# Patient Record
Sex: Male | Born: 1950 | Race: White | Hispanic: No | Marital: Married | Smoking: Former smoker
Health system: Southern US, Community
[De-identification: ages and names within clinical notes are randomized; demographics above are authoritative.]

## PROBLEM LIST (undated history)

## (undated) DIAGNOSIS — N200 Calculus of kidney: Secondary | ICD-10-CM

## (undated) DIAGNOSIS — F32A Depression, unspecified: Secondary | ICD-10-CM

## (undated) DIAGNOSIS — G5603 Carpal tunnel syndrome, bilateral upper limbs: Secondary | ICD-10-CM

## (undated) DIAGNOSIS — R269 Unspecified abnormalities of gait and mobility: Secondary | ICD-10-CM

## (undated) DIAGNOSIS — G35 Multiple sclerosis: Secondary | ICD-10-CM

## (undated) DIAGNOSIS — M545 Low back pain, unspecified: Secondary | ICD-10-CM

## (undated) DIAGNOSIS — G8929 Other chronic pain: Secondary | ICD-10-CM

## (undated) DIAGNOSIS — E1142 Type 2 diabetes mellitus with diabetic polyneuropathy: Secondary | ICD-10-CM

## (undated) DIAGNOSIS — H3530 Unspecified macular degeneration: Secondary | ICD-10-CM

## (undated) DIAGNOSIS — E119 Type 2 diabetes mellitus without complications: Secondary | ICD-10-CM

## (undated) DIAGNOSIS — R350 Frequency of micturition: Secondary | ICD-10-CM

## (undated) DIAGNOSIS — M79606 Pain in leg, unspecified: Secondary | ICD-10-CM

## (undated) DIAGNOSIS — E669 Obesity, unspecified: Secondary | ICD-10-CM

## (undated) DIAGNOSIS — R5382 Chronic fatigue, unspecified: Secondary | ICD-10-CM

## (undated) DIAGNOSIS — F329 Major depressive disorder, single episode, unspecified: Secondary | ICD-10-CM

## (undated) HISTORY — DX: Obesity, unspecified: E66.9

## (undated) HISTORY — DX: Unspecified abnormalities of gait and mobility: R26.9

## (undated) HISTORY — DX: Other chronic pain: G89.29

## (undated) HISTORY — DX: Frequency of micturition: R35.0

## (undated) HISTORY — DX: Major depressive disorder, single episode, unspecified: F32.9

## (undated) HISTORY — DX: Type 2 diabetes mellitus with diabetic polyneuropathy: E11.42

## (undated) HISTORY — PX: HERNIA REPAIR: SHX51

## (undated) HISTORY — DX: Carpal tunnel syndrome, bilateral upper limbs: G56.03

## (undated) HISTORY — DX: Multiple sclerosis: G35

## (undated) HISTORY — DX: Pain in leg, unspecified: M79.606

## (undated) HISTORY — DX: Low back pain: M54.5

## (undated) HISTORY — DX: Low back pain, unspecified: M54.50

## (undated) HISTORY — DX: Unspecified macular degeneration: H35.30

## (undated) HISTORY — DX: Chronic fatigue, unspecified: R53.82

## (undated) HISTORY — DX: Type 2 diabetes mellitus without complications: E11.9

## (undated) HISTORY — DX: Calculus of kidney: N20.0

## (undated) HISTORY — PX: EYE SURGERY: SHX253

## (undated) HISTORY — DX: Depression, unspecified: F32.A

## (undated) HISTORY — PX: TONSILLECTOMY: SUR1361

---

## 1998-05-10 ENCOUNTER — Ambulatory Visit (HOSPITAL_COMMUNITY): Admission: RE | Admit: 1998-05-10 | Discharge: 1998-05-10 | Payer: Self-pay | Admitting: Family Medicine

## 1999-05-29 ENCOUNTER — Ambulatory Visit (HOSPITAL_COMMUNITY): Admission: RE | Admit: 1999-05-29 | Discharge: 1999-05-29 | Payer: Self-pay | Admitting: Gastroenterology

## 2002-07-24 ENCOUNTER — Ambulatory Visit (HOSPITAL_COMMUNITY): Admission: RE | Admit: 2002-07-24 | Discharge: 2002-07-24 | Payer: Self-pay | Admitting: Gastroenterology

## 2003-12-31 ENCOUNTER — Encounter: Admission: RE | Admit: 2003-12-31 | Discharge: 2003-12-31 | Payer: Self-pay | Admitting: Family Medicine

## 2004-01-27 ENCOUNTER — Ambulatory Visit (HOSPITAL_COMMUNITY): Admission: RE | Admit: 2004-01-27 | Discharge: 2004-01-27 | Payer: Self-pay | Admitting: Neurology

## 2004-03-27 ENCOUNTER — Ambulatory Visit: Payer: Self-pay | Admitting: Family Medicine

## 2004-06-06 ENCOUNTER — Ambulatory Visit: Payer: Self-pay | Admitting: Family Medicine

## 2004-08-30 ENCOUNTER — Ambulatory Visit: Payer: Self-pay | Admitting: Family Medicine

## 2004-11-13 ENCOUNTER — Ambulatory Visit (HOSPITAL_COMMUNITY): Admission: RE | Admit: 2004-11-13 | Discharge: 2004-11-13 | Payer: Self-pay | Admitting: Family Medicine

## 2004-12-14 ENCOUNTER — Ambulatory Visit: Payer: Self-pay | Admitting: Family Medicine

## 2005-01-10 ENCOUNTER — Ambulatory Visit: Payer: Self-pay | Admitting: Family Medicine

## 2005-04-12 ENCOUNTER — Ambulatory Visit: Payer: Self-pay | Admitting: Family Medicine

## 2005-07-19 ENCOUNTER — Ambulatory Visit: Payer: Self-pay | Admitting: Family Medicine

## 2005-08-06 ENCOUNTER — Ambulatory Visit (HOSPITAL_COMMUNITY): Admission: RE | Admit: 2005-08-06 | Discharge: 2005-08-06 | Payer: Self-pay | Admitting: Family Medicine

## 2005-10-23 ENCOUNTER — Ambulatory Visit: Payer: Self-pay | Admitting: Family Medicine

## 2006-01-03 ENCOUNTER — Ambulatory Visit: Payer: Self-pay | Admitting: Family Medicine

## 2006-01-29 ENCOUNTER — Ambulatory Visit: Payer: Self-pay | Admitting: Family Medicine

## 2006-02-18 ENCOUNTER — Encounter: Admission: RE | Admit: 2006-02-18 | Discharge: 2006-05-19 | Payer: Self-pay | Admitting: Specialist

## 2006-04-11 ENCOUNTER — Ambulatory Visit: Payer: Self-pay | Admitting: Family Medicine

## 2006-07-11 ENCOUNTER — Ambulatory Visit: Payer: Self-pay | Admitting: Family Medicine

## 2006-10-10 ENCOUNTER — Ambulatory Visit: Payer: Self-pay | Admitting: Family Medicine

## 2006-12-31 ENCOUNTER — Encounter: Admission: RE | Admit: 2006-12-31 | Discharge: 2007-01-23 | Payer: Self-pay | Admitting: Family Medicine

## 2007-08-04 ENCOUNTER — Ambulatory Visit (HOSPITAL_COMMUNITY): Admission: RE | Admit: 2007-08-04 | Discharge: 2007-08-04 | Payer: Self-pay | Admitting: *Deleted

## 2007-09-14 ENCOUNTER — Encounter: Admission: RE | Admit: 2007-09-14 | Discharge: 2007-09-14 | Payer: Self-pay | Admitting: Neurology

## 2008-07-23 ENCOUNTER — Encounter: Admission: RE | Admit: 2008-07-23 | Discharge: 2008-07-23 | Payer: Self-pay | Admitting: Family Medicine

## 2008-10-07 ENCOUNTER — Encounter: Admission: RE | Admit: 2008-10-07 | Discharge: 2008-10-07 | Payer: Self-pay | Admitting: Family Medicine

## 2008-10-12 ENCOUNTER — Ambulatory Visit (HOSPITAL_BASED_OUTPATIENT_CLINIC_OR_DEPARTMENT_OTHER): Admission: RE | Admit: 2008-10-12 | Discharge: 2008-10-12 | Payer: Self-pay | Admitting: Urology

## 2010-05-14 ENCOUNTER — Encounter: Payer: Self-pay | Admitting: Family Medicine

## 2010-07-31 LAB — POCT I-STAT 4, (NA,K, GLUC, HGB,HCT)
Glucose, Bld: 181 mg/dL — ABNORMAL HIGH (ref 70–99)
HCT: 47 % (ref 39.0–52.0)
Hemoglobin: 16 g/dL (ref 13.0–17.0)
Potassium: 4.9 mEq/L (ref 3.5–5.1)
Sodium: 139 mEq/L (ref 135–145)

## 2010-07-31 LAB — GLUCOSE, CAPILLARY: Glucose-Capillary: 154 mg/dL — ABNORMAL HIGH (ref 70–99)

## 2010-09-05 NOTE — Op Note (Signed)
NAME:  Derek Collins, Derek Collins                 ACCOUNT NO.:  [id number]   MEDICAL RECORD NO.:  [id number]          PATIENT TYPE:  AMB   LOCATION:  [facility]                         FACILITY:  [facility]   PHYSICIAN:  Excell Seltzer. Annabell Howells, M.D.    DATE OF BIRTH:  Nov 03, 1950   DATE OF PROCEDURE:  10/12/2008  DATE OF DISCHARGE:                               OPERATIVE REPORT   PROCEDURE:  Left cystoscopy, left retrograde pyelogram with  interpretation, left ureteroscopic stone extraction with holmium laser  lithotripsy and left stent insertion.   PREOPERATIVE DIAGNOSIS:  Left mid ureteral stone.   POSTOPERATIVE DIAGNOSIS:  Left mid ureteral stone.   SURGEON:  Dr. Bjorn Pippin   ANESTHESIA:  General.   SPECIMEN:  Stone fragments.   DRAINS:  6-French 26 cm double-J stent.   COMPLICATIONS:  None.   INDICATIONS:  Mr. Cumbie is a 60 year old white male with a 6-8 mm left  mid ureteral stone who is to undergo ureteroscopy.   FINDINGS AND PROCEDURE:  The patient was taken operating room after  receiving Cipro. A general anesthetic was induced.  He was placed in  lithotomy position.  His perineum and genitalia were prepped with  Betadine solution and he was draped in the usual sterile fashion.  Cystoscopy was performed using a 22-French scope and 12 degree lens  examination revealed a normal urethra.  The external sphincter was  intact.  Prostatic urethra was short but had trilobar hyperplasia with  mild obstruction.  Examination of bladder revealed mild trabeculation.  No tumor, stones or inflammation were noted.  Ureteral orifices were  unremarkable.   Left ureteral orifice was cannulated with a 5-French catheter and  contrast instilled.   Left retrograde pyelogram revealed an ovoid filling defect in the upper  portion of the mid ureter consistent with a stone seen on CT scan.  He  was felt to have probable uric acid stone.  There was no proximal  dilation.   After retrograde pyelograms a wire was placed to  the kidney and a 14-  French introducer sheath inner core dilator was then introduced to the  level of the stone.  A 6-French short ureteroscope was passed as far  proximally as possible and the stone was not reached.  The introducer  sheath was then reinserted with the sheath included, the core was  removed along the wire and the 6-French flexible ureteroscope was  inserted.  Initially the stone was not seen.  However on withdrawal of  the scope and pulling back somewhat on the introducer sheath, the stone  was visualized.  The stone was then engaged with a 200 micron holmium  laser fiber at 0.5 watts and 10 Hz.  The stone was fragmented into  manageable fragments which were then removed using a combination of  irrigation and a nitinol basket.  Once all significant fragments were  removed and the ureter was inspected along with the kidney,  a guidewire  was passed back through the introducer sheath which was then removed.   Cystoscope was reinserted over the introducer sheath and a 6-French 26-  cm double-J stent with string was inserted over the wire to the kidney  under fluoroscopic guidance.  The wire was removed leaving good coil in  the kidney and good coil in the bladder.  The stent string was secured  to the patient's penis.  He was taken down from lithotomy position.  His  anesthetic was reversed.  He was moved to the recovery room in stable  condition.  There were no complications.      Excell Seltzer. Annabell Howells, M.D.  Electronically Signed     JJW/[id number]  D:  10/12/2008  T:  10/13/2008  Job:  [id number]   cc:   Delaney Meigs, M.D.  Fax: [phone]

## 2010-09-08 NOTE — Procedures (Signed)
[place]  Patient:    Derek Collins                         MRN: [id number] Proc. Date: 05/29/99 Adm. Date:  [id number] Attending:  Orland Mustard CC:         Delaney Meigs, M.D., [place], [place]                           Procedure Report  PROCEDURE:  Colonoscopy and polypectomy.  MEDICATION:  Fentanyl 70 mcg, versed 7 mg IV.  INDICATIONS FOR PROCEDURE:  Occasional rectal bleeding and pain in a gentleman ith a very strong family history of colon polyps.  DESCRIPTION OF THE PROCEDURE:  The procedure had been explained to the patient nd consent obtained.  With the patient in left lateral decubitus position the Olympus Adult video colonoscope inserted and advanced under direct visualization.  The rep was excellent.  It was advanced around to the cecum without difficulty.  The ileocecal valve was seen.  Typical cecal configuration was visualized.  The scope was withdrawn.  The cecum, ascending colon, hepatic flexure, and transverse colon were seen well and in the distal transverse colon a 1 cm polyp was encountered.  This was removed with a snare and sucked through the scope.  There were no polyps seen in the descending or sigmoid colon.  No significant diverticular disease.  Internal hemorrhoids were seen in the rectum upon removal of the scope.  The scope was withdrawn and the patient tolerated the procedure well and was maintained on low-flow oxygen and pulse oximeter throughout the procedure with no obvious problem.  ASSESSMENT: 1. Transverse colon polyp removed. 2. Internal hemorrhoids.  PLAN: 1. Will check pathology. 2. Will recommend repeating in three years. DD:  05/29/99 TD:  05/29/99 Job: [id number]

## 2012-03-06 DIAGNOSIS — R6889 Other general symptoms and signs: Secondary | ICD-10-CM | POA: Insufficient documentation

## 2012-03-06 DIAGNOSIS — G35D Multiple sclerosis, unspecified: Secondary | ICD-10-CM | POA: Insufficient documentation

## 2012-03-06 DIAGNOSIS — G63 Polyneuropathy in diseases classified elsewhere: Secondary | ICD-10-CM | POA: Insufficient documentation

## 2012-03-06 DIAGNOSIS — G35 Multiple sclerosis: Secondary | ICD-10-CM | POA: Insufficient documentation

## 2012-03-06 DIAGNOSIS — M79609 Pain in unspecified limb: Secondary | ICD-10-CM | POA: Insufficient documentation

## 2012-03-06 DIAGNOSIS — Z5181 Encounter for therapeutic drug level monitoring: Secondary | ICD-10-CM | POA: Insufficient documentation

## 2012-03-06 DIAGNOSIS — E1142 Type 2 diabetes mellitus with diabetic polyneuropathy: Secondary | ICD-10-CM | POA: Insufficient documentation

## 2012-03-06 DIAGNOSIS — M545 Low back pain: Secondary | ICD-10-CM | POA: Insufficient documentation

## 2012-03-06 DIAGNOSIS — G5600 Carpal tunnel syndrome, unspecified upper limb: Secondary | ICD-10-CM | POA: Insufficient documentation

## 2012-03-06 DIAGNOSIS — R269 Unspecified abnormalities of gait and mobility: Secondary | ICD-10-CM | POA: Insufficient documentation

## 2012-03-06 DIAGNOSIS — S339XXA Sprain of unspecified parts of lumbar spine and pelvis, initial encounter: Secondary | ICD-10-CM | POA: Insufficient documentation

## 2012-09-03 ENCOUNTER — Encounter: Payer: Self-pay | Admitting: Neurology

## 2012-09-03 ENCOUNTER — Ambulatory Visit (INDEPENDENT_AMBULATORY_CARE_PROVIDER_SITE_OTHER): Payer: BC Managed Care – PPO | Admitting: Neurology

## 2012-09-03 VITALS — BP 127/78 | HR 96 | Ht 71.5 in | Wt 198.0 lb

## 2012-09-03 DIAGNOSIS — E1142 Type 2 diabetes mellitus with diabetic polyneuropathy: Secondary | ICD-10-CM

## 2012-09-03 DIAGNOSIS — S339XXA Sprain of unspecified parts of lumbar spine and pelvis, initial encounter: Secondary | ICD-10-CM

## 2012-09-03 DIAGNOSIS — R6889 Other general symptoms and signs: Secondary | ICD-10-CM

## 2012-09-03 DIAGNOSIS — G35 Multiple sclerosis: Secondary | ICD-10-CM

## 2012-09-03 DIAGNOSIS — M545 Low back pain, unspecified: Secondary | ICD-10-CM

## 2012-09-03 DIAGNOSIS — G35D Multiple sclerosis, unspecified: Secondary | ICD-10-CM

## 2012-09-03 DIAGNOSIS — Z5181 Encounter for therapeutic drug level monitoring: Secondary | ICD-10-CM

## 2012-09-03 DIAGNOSIS — G63 Polyneuropathy in diseases classified elsewhere: Secondary | ICD-10-CM

## 2012-09-03 DIAGNOSIS — M79609 Pain in unspecified limb: Secondary | ICD-10-CM

## 2012-09-03 DIAGNOSIS — R269 Unspecified abnormalities of gait and mobility: Secondary | ICD-10-CM

## 2012-09-03 NOTE — Progress Notes (Signed)
Reason for visit: Multiple sclerosis  Derek Collins is an 62 y.o. male  History of present illness:  Mr. Derek Collins is a 62 year old right-handed white male with a history of multiple sclerosis, diabetes, and a diabetic peripheral neuropathy. The patient has a gait disorder, and he is fallen on 2 occasions since last seen. The patient indicates that he will have a tendency for the left knee to buckle, and he may go down. The patient also reports right hip discomfort that is present when he first gets up from a seated position, but can it tends to get better when he walks. The patient denies any pain in the right hip with sitting or lying down. The patient is on Rebif, and he is tolerating medication well. The patient is still working, but his hours are quite limited, working 5-1/2 hours on Thursdays. The patient does not use a cane for ambulation. No other new medical issues have come up since last seen. His primary care physician has recently checked blood work that has included a CBC and a liver profile.  Past Medical History  Diagnosis Date  . Multiple sclerosis   . Gait disorder   . Chronic fatigue   . Chronic low back pain   . Chronic leg pain   . Renal calculi   . Depression   . Diabetes   . Diabetic peripheral neuropathy   . Obesity   . Carpal tunnel syndrome, bilateral     Past Surgical History  Procedure Laterality Date  . Rotator cuff repair      Family History  Problem Relation Age of Onset  . Stroke Father   . Cancer Sister     Breast cancer/Renal cell carcinoma    Social history:  reports that he has never smoked. He does not have any smokeless tobacco history on file. He reports that  drinks alcohol. He reports that he does not use illicit drugs.  Allergies: No Known Allergies  Medications:  No current outpatient prescriptions on file prior to visit.   No current facility-administered medications on file prior to visit.    ROS:  Out of a complete 14 system  review of symptoms, the patient complains only of the following symptoms, and all other reviewed systems are negative.  Fatigue Hearing changes Blurred vision Snoring Incontinence of bowel and bladder Easy bleeding Feeling hot, cold, increased thirst Joint pain, achy muscles Numbness, weakness Restless legs, sleepiness  Blood pressure 127/78, pulse 96, height 5' 11.5" (1.816 m), weight 198 lb (89.812 kg).  Physical Exam  General: The patient is alert and cooperative at the time of the examination. The patient is moderately obese.  Skin: No significant peripheral edema is noted.   Neurologic Exam  Cranial nerves: Facial symmetry is present. Speech is normal, no aphasia or dysarthria is noted. Extraocular movements are full. Visual fields are full.  Motor: The patient has good strength in all 4 extremities, with the exception that there is some grip strength weakness of the right hand.  Coordination: The patient has good finger-nose-finger and heel-to-shin bilaterally.  Gait and station: The patient has a slightly wide-based, limping type gait. Gait is somewhat unsteady, the patient does not use a cane. Tandem gait is unsteady. Romberg is also unsteady, but the patient does not fall. No drift is seen.  Reflexes: Deep tendon reflexes are symmetric, but are brisk throughout with exception of reduced ankle jerk reflexes.   Assessment/Plan:  One. Multiple sclerosis  2. Diabetes  3. 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 We will check an x-ray of the hip, but if this is unremarkable, and the pain continues, MRI evaluation of the hip may be indicated. The patient will continue his Rebif. The patient will followup through this office in 6 months. The patient is not able to physically handle a full work week, greater than 32 hours. The patient is applying for disability.  Marlan Palau  MD 09/03/2012 4:25 PM  [facility] [address] [place], [place] [address]  Phone [phone] Fax [phone]

## 2013-01-20 ENCOUNTER — Telehealth: Payer: Self-pay

## 2013-01-20 NOTE — Telephone Encounter (Signed)
Derek Collins called stating Rebif requires a prior auth through ins.  I have submitted all requires info to ins, pending response.

## 2013-02-04 ENCOUNTER — Other Ambulatory Visit: Payer: Self-pay | Admitting: Gastroenterology

## 2013-02-25 ENCOUNTER — Other Ambulatory Visit: Payer: Self-pay | Admitting: Neurology

## 2013-03-06 ENCOUNTER — Ambulatory Visit (INDEPENDENT_AMBULATORY_CARE_PROVIDER_SITE_OTHER): Payer: BC Managed Care – PPO | Admitting: Neurology

## 2013-03-06 ENCOUNTER — Encounter: Payer: Self-pay | Admitting: Neurology

## 2013-03-06 VITALS — BP 141/83 | HR 103 | Wt 195.0 lb

## 2013-03-06 DIAGNOSIS — E1142 Type 2 diabetes mellitus with diabetic polyneuropathy: Secondary | ICD-10-CM

## 2013-03-06 DIAGNOSIS — G35 Multiple sclerosis: Secondary | ICD-10-CM

## 2013-03-06 DIAGNOSIS — R269 Unspecified abnormalities of gait and mobility: Secondary | ICD-10-CM

## 2013-03-06 DIAGNOSIS — M25559 Pain in unspecified hip: Secondary | ICD-10-CM

## 2013-03-06 DIAGNOSIS — M25551 Pain in right hip: Secondary | ICD-10-CM

## 2013-03-06 MED ORDER — GABAPENTIN 400 MG PO CAPS: 400.0000 mg | ORAL_CAPSULE | Freq: Three times a day (TID) | ORAL | Status: DC

## 2013-03-06 MED ORDER — VENLAFAXINE HCL ER 75 MG PO CP24: 75.0000 mg | ORAL_CAPSULE | Freq: Two times a day (BID) | ORAL | Status: DC

## 2013-03-06 NOTE — Patient Instructions (Signed)
Multiple Sclerosis Multiple sclerosis (MS) is a disease of the central nervous system. Its cause is unknown. It is more common in the northern states than in the southern states. There is a higher incidence of MS in women. There is a wide variation in the symptoms (problems) of MS. This is because of the many different ways it affects the central nervous system. It often comes on in episodes or attacks. These attacks may last weeks to months. There may be long periods of nearly no problems between attacks. The main symptoms include visual problems (associated with eye pain), numbness, weakness, and paralysis in extremities (arms/hands and legs/feet). There may also be tremors and problems with balance and walking. The age when MS starts is variable. Advances in medicine continue to improve the treatment of this illness. There is no known cure for MS but there are medications that help. MS is not an inherited illness, although your risk of getting this disease is higher if you have a relative with MS. The best radiologic (x-ray) study for MS is an MRI (magnetic resonance imaging). There are medications available to decrease the number and frequency of attacks. SYMPTOMS  The symptoms of MS are caused by loss of insulation (myelin) of the nerves of the brain. When this happens, brain signals do not get transmitted properly or may not get transmitted at all. Some of the problems caused by this include:   Numbness.  Weakness.  Paralysis in extremities.  Visual problems, eye pain.  Balance problems.  Tremors. DIAGNOSIS  Your caregiver can do studies on you to make this diagnosis. This may include specialized X-rays and spinal fluid studies. HOME CARE INSTRUCTIONS   Take medications as directed by your caregiver. Baclofen is a drug commonly used to reduce muscle spasticity. Steroids are often used for short term relief.  Exercise as directed.  Use physical and occupational therapy as directed by  your caregiver. Careful attention to this medical care can help avoid depression.  See your caregiver if you begin to have problems with depression. This is a common problem in MS. Patients often continue to work many years after the diagnosis of MS. Document Released: 04/06/2000 Document Revised: 07/02/2011 Document Reviewed: 11/13/2006 Exit[employer] Patient Information 2014 [employer].  

## 2013-03-06 NOTE — Progress Notes (Signed)
Reason for visit: Multiple sclerosis  Derek Collins is an 62 y.o. male  History of present illness:  Derek Collins is a 62 year old right-handed white male with a history of diabetes, diabetic peripheral neuropathy, and multiple sclerosis. The patient has a gait disorder associated with this. The patient has not fallen since last seen, but he has stumbled on occasion. The patient does have some right hip discomfort with weightbearing, but he never had the hip x-ray that was ordered on his last visit. The patient is on gabapentin taking 300 mg 3 times daily for his peripheral neuropathy pain, but the pain has worsened over time. The patient at times has difficulty sleeping at night. The patient is on Effexor as well for the neuropathy. The patient takes Rebif injections, which he tolerates this fairly well. 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 The patient does have urinary urgency, occasional incontinence. The patient is on oxybutynin for his bladder. The patient returns for an evaluation.  Past Medical History  Diagnosis Date  . Multiple sclerosis   . Gait disorder   . Chronic fatigue   . Chronic low back pain   . Chronic leg pain   . Renal calculi   . Depression   . Diabetes   . Diabetic peripheral neuropathy   . Obesity   . Carpal tunnel syndrome, bilateral     Past Surgical History  Procedure Laterality Date  . Rotator cuff repair      Family History  Problem Relation Age of Onset  . Stroke Father   . Cancer Sister     Breast cancer/Renal cell carcinoma    Social history:  reports that he has never smoked. He does not have any smokeless tobacco history on file. He reports that he drinks alcohol. He reports that he does not use illicit drugs.   No Known Allergies  Medications:  Current Outpatient Prescriptions on File Prior to Visit  Medication Sig Dispense Refill  . GLIPIZIDE XL 10 MG 24 hr tablet Take 10 mg by mouth  daily.      Marland Kitchen JANUVIA 100 MG tablet Take 100 mg by mouth daily.      . metFORMIN (GLUCOPHAGE-XR) 500 MG 24 hr tablet Take 500 mg by mouth 4 (four) times daily.       . modafinil (PROVIGIL) 200 MG tablet Take 200 mg by mouth daily.      Marland Kitchen oxybutynin (DITROPAN) 5 MG tablet Take 5 mg by mouth 2 (two) times daily.      Marland Kitchen REBIF 44 MCG/0.5ML injection Inject 44 mcg into the skin 3 (three) times daily.      . Testosterone (ANDROGEL PUMP) 12.5 MG/ACT (1%) GEL Place 4 Squirts onto the skin daily.       No current facility-administered medications on file prior to visit.    ROS:  Out of a complete 14 system review of symptoms, the patient complains only of the following symptoms, and all other reviewed systems are negative.  Fatigue Hearing loss Diarrhea Incontinence of bladder Easy bruising, easy bleeding Joint pain Numbness, weakness Too much sleep, decreased energy Restless legs  Blood pressure 141/83, pulse 103, weight 195 lb (88.451 kg).  Physical Exam  General: The patient is alert and cooperative at the time of the examination.The patient is moderately obese.  Skin: No significant peripheral edema is noted.   Neurologic Exam  Mental status: The patient is oriented x 3.  Cranial nerves: Facial symmetry is present. Speech is normal, no aphasia or dysarthria is noted. Extraocular movements are full. Visual fields are full.  Motor: The patient has good strength in all 4 extremities, with a question of mild proximal weakness of the legs bilaterally.  Sensory examination:Soft touch sensation on the face, arms, and legs is symmetric.  Coordination: The patient has good finger-nose-finger and heel-to-shin bilaterally.  Gait and station: The patient has a limping type gait on the right leg. Tandem gait is unsteady. Romberg is negative. No drift is seen.  Reflexes: Deep tendon reflexes are symmetric, but are somewhat brisk throughout.   Assessment/Plan:  One. Multiple  sclerosis  2. Diabetes  3. Diabetic peripheral neuropathy  4. Gait disorder  The patient is doing well on the Rebif. He will continue the medication for now. The patient will be sent for an x-ray of the right hip. The patient will go up on the gabapentin taking 400 mg 3 times daily for the peripheral neuropathy pain. A prescription was also given to renew the Effexor. The patient will followup in 6 months.  Derek Palau MD 03/06/2013 7:57 PM  [facility] [address] [place], [place] [address]  Phone [phone] Fax [phone]

## 2013-03-09 ENCOUNTER — Other Ambulatory Visit: Payer: Self-pay | Admitting: Neurology

## 2013-04-06 ENCOUNTER — Telehealth: Payer: Self-pay

## 2013-04-06 NOTE — Telephone Encounter (Signed)
Derek Collins called, left message saying they need refills on Rebif ASAP.  We sent [facility] 6 month supply of meds last month.  I called [facility], spoke with [facility].  She said they have refills on file, they are just waiting for the patient to call them and schedule shipment.  I called the patient back.  Got no answer.  Left message asking that they call the pharmacy to schedule delivery of Rx.

## 2013-09-11 ENCOUNTER — Encounter (INDEPENDENT_AMBULATORY_CARE_PROVIDER_SITE_OTHER): Payer: Self-pay

## 2013-09-11 ENCOUNTER — Encounter: Payer: Self-pay | Admitting: Neurology

## 2013-09-11 ENCOUNTER — Ambulatory Visit (INDEPENDENT_AMBULATORY_CARE_PROVIDER_SITE_OTHER): Payer: BC Managed Care – PPO | Admitting: Neurology

## 2013-09-11 VITALS — BP 122/79 | HR 92 | Wt 197.5 lb

## 2013-09-11 DIAGNOSIS — G35 Multiple sclerosis: Secondary | ICD-10-CM

## 2013-09-11 DIAGNOSIS — R269 Unspecified abnormalities of gait and mobility: Secondary | ICD-10-CM

## 2013-09-11 DIAGNOSIS — R112 Nausea with vomiting, unspecified: Secondary | ICD-10-CM

## 2013-09-11 DIAGNOSIS — Z5181 Encounter for therapeutic drug level monitoring: Secondary | ICD-10-CM

## 2013-09-11 DIAGNOSIS — G63 Polyneuropathy in diseases classified elsewhere: Secondary | ICD-10-CM

## 2013-09-11 NOTE — Patient Instructions (Signed)

## 2013-09-11 NOTE — Progress Notes (Signed)
Reason for visit: Multiple sclerosis  Derek Collins is an 63 y.o. male  History of present illness:  Derek Collins is a 63 year old right-handed white male with a history of multiple sclerosis, diabetes, and a gait disorder. The patient remains on Rebif, and he is tolerating the medication well. The patient has had some excessive daytime drowsiness even taking Provigil 200 mg twice a day. The patient also has had a six-month history of chronic issues with nausea, and occasional vomiting. He may have sudden onset of vomiting at times. The patient is still working occasionally, but he has not worked in over 2 months. The patient has not had any recent falls, and he does not use a cane for ambulation. He reports right hip discomfort with weightbearing. He returns to this office for an evaluation. The patient takes gabapentin for his diabetic neuropathy.  Past Medical History  Diagnosis Date  . Multiple sclerosis   . Gait disorder   . Chronic fatigue   . Chronic low back pain   . Chronic leg pain   . Renal calculi   . Depression   . Diabetes   . Diabetic peripheral neuropathy   . Obesity   . Carpal tunnel syndrome, bilateral   . Macular degeneration of left eye     Past Surgical History  Procedure Laterality Date  . Rotator cuff repair      Family History  Problem Relation Age of Onset  . Stroke Father   . Cancer Sister     Breast cancer/Renal cell carcinoma    Social history:  reports that he has quit smoking. His smoking use included Cigarettes. He has a 25 pack-year smoking history. He has never used smokeless tobacco. He reports that he drinks alcohol. He reports that he does not use illicit drugs.   No Known Allergies  Medications:  Current Outpatient Prescriptions on File Prior to Visit  Medication Sig Dispense Refill  . gabapentin (NEURONTIN) 400 MG capsule Take 1 capsule (400 mg total) by mouth 3 (three) times daily.  90 capsule  11  . GLIPIZIDE XL 10 MG 24 hr tablet  Take 10 mg by mouth 2 (two) times daily.       Marland Kitchen JANUVIA 100 MG tablet Take 100 mg by mouth daily.      . metFORMIN (GLUCOPHAGE-XR) 500 MG 24 hr tablet Take 500 mg by mouth 4 (four) times daily.       . modafinil (PROVIGIL) 200 MG tablet Take 200 mg by mouth 2 (two) times daily.       Marland Kitchen oxybutynin (DITROPAN) 5 MG tablet Take 5 mg by mouth 2 (two) times daily.      . permethrin (ELIMITE) 5 % cream Apply 5 g topically.      . REBIF 44 MCG/0.5ML injection INJECT 44 MCG UNDER THE SKIN 3 TIMES A WEEK  18 mL  1  . Testosterone (ANDROGEL PUMP) 12.5 MG/ACT (1%) GEL Place 4 Squirts onto the skin daily.      Marland Kitchen venlafaxine XR (EFFEXOR-XR) 75 MG 24 hr capsule Take 1 capsule (75 mg total) by mouth 2 (two) times daily.  60 capsule  11   No current facility-administered medications on file prior to visit.    ROS:  Out of a complete 14 system review of symptoms, the patient complains only of the following symptoms, and all other reviewed systems are negative.  Activity change, fatigue Light sensitivity, blurred vision Cold intolerance, heat intolerance, excessive thirst Abdominal pain,  diarrhea, nausea Restless legs, daytime sleepiness, snoring Frequency of urination, urgency  Joint pain, and walking difficulties Weakness  Blood pressure 122/79, pulse 92, weight 197 lb 8 oz (89.585 kg).  Physical Exam  General: The patient is alert and cooperative at the time of the examination.  Skin: No significant peripheral edema is noted.   Neurologic Exam  Mental status: The patient is oriented x 3.  Cranial nerves: Facial symmetry is present. Speech is normal, no aphasia or dysarthria is noted. Extraocular movements are full. Visual fields are full. Pupils are equal, round, and reactive to light. Discs are flat bilaterally.  Motor: The patient has good strength in all 4 extremities, with the exception that there is some proximal weakness in the legs that is slight, right greater than left.  Sensory  examination: Soft touch sensation is symmetric on the face, arms, and legs.  Coordination: The patient has good finger-nose-finger and heel-to-shin bilaterally.  Gait and station: The patient has slightly wide-based, unsteady gait. The patient has a limping quality with walking on the right leg. Tandem gait is slightly unsteady. Romberg is negative. No drift is seen.  Reflexes: Deep tendon reflexes are symmetric, but the reflexes are somewhat brisk throughout.   Assessment/Plan:  1. Multiple sclerosis  2. Gait disturbance  3. Diabetes  4. Peripheral neuropathy  The patient has developed some chronic nausea, he could potentially have gastroparesis associated with his diabetes. I will refer him to a gastroenterologist. The patient will remain on Rebif, and he will have blood work today. He will be set up for MRI evaluation of the brain and cervical spinal cord as a followup. He will followup in about 6 months. The patient has a limping gait secondary to right hip discomfort.  Marlan Palau. Keith Lonie Newsham MD 09/14/2013 11:46 AM  [facility] [address] [place], [place] [address]  Phone [phone] Fax [phone]

## 2013-09-12 LAB — COMPREHENSIVE METABOLIC PANEL
ALK PHOS: 111 IU/L (ref 39–117)
ALT: 19 IU/L (ref 0–44)
AST: 19 IU/L (ref 0–40)
Albumin/Globulin Ratio: 2.1 (ref 1.1–2.5)
Albumin: 5 g/dL — ABNORMAL HIGH (ref 3.6–4.8)
BUN/Creatinine Ratio: 16 (ref 10–22)
BUN: 17 mg/dL (ref 8–27)
CO2: 23 mmol/L (ref 18–29)
Calcium: 10.8 mg/dL — ABNORMAL HIGH (ref 8.6–10.2)
Chloride: 97 mmol/L (ref 97–108)
Creatinine, Ser: 1.09 mg/dL (ref 0.76–1.27)
GFR calc non Af Amer: 72 mL/min/{1.73_m2} (ref >59)
GFR, EST AFRICAN AMERICAN: 84 mL/min/{1.73_m2} (ref >59)
Globulin, Total: 2.4 g/dL (ref 1.5–4.5)
Glucose: 163 mg/dL — ABNORMAL HIGH (ref 65–99)
POTASSIUM: 5 mmol/L (ref 3.5–5.2)
Sodium: 138 mmol/L (ref 134–144)
Total Bilirubin: 0.3 mg/dL (ref 0.0–1.2)
Total Protein: 7.4 g/dL (ref 6.0–8.5)

## 2013-09-12 LAB — CBC WITH DIFFERENTIAL
BASOS: 0 %
Basophils Absolute: 0 10*3/uL (ref 0.0–0.2)
EOS: 2 %
Eosinophils Absolute: 0.1 10*3/uL (ref 0.0–0.4)
HCT: 41.3 % (ref 37.5–51.0)
HEMOGLOBIN: 13.9 g/dL (ref 12.6–17.7)
IMMATURE GRANULOCYTES: 0 %
Immature Grans (Abs): 0 10*3/uL (ref 0.0–0.1)
LYMPHS ABS: 1.3 10*3/uL (ref 0.7–3.1)
Lymphs: 20 %
MCH: 30 pg (ref 26.6–33.0)
MCHC: 33.7 g/dL (ref 31.5–35.7)
MCV: 89 fL (ref 79–97)
MONOCYTES: 7 %
Monocytes Absolute: 0.4 10*3/uL (ref 0.1–0.9)
Neutrophils Absolute: 4.5 10*3/uL (ref 1.4–7.0)
Neutrophils Relative %: 71 %
PLATELETS: 277 10*3/uL (ref 150–379)
RBC: 4.63 x10E6/uL (ref 4.14–5.80)
RDW: 13.9 % (ref 12.3–15.4)
WBC: 6.4 10*3/uL (ref 3.4–10.8)

## 2013-09-14 ENCOUNTER — Telehealth: Payer: Self-pay | Admitting: Neurology

## 2013-09-14 NOTE — Telephone Encounter (Signed)
I called the patient. The CBC is normal, and the CMP shows a slightly high calcium level. If he is on calcium supplimentation, he is to stop this.

## 2013-09-24 ENCOUNTER — Ambulatory Visit (INDEPENDENT_AMBULATORY_CARE_PROVIDER_SITE_OTHER): Payer: BC Managed Care – PPO

## 2013-09-24 DIAGNOSIS — G35 Multiple sclerosis: Secondary | ICD-10-CM

## 2013-09-24 DIAGNOSIS — R269 Unspecified abnormalities of gait and mobility: Secondary | ICD-10-CM

## 2013-09-24 DIAGNOSIS — G63 Polyneuropathy in diseases classified elsewhere: Secondary | ICD-10-CM

## 2013-09-24 MED ORDER — GADOPENTETATE DIMEGLUMINE 469.01 MG/ML IV SOLN: 18.0000 mL | Freq: Once | INTRAVENOUS | Status: AC | PRN

## 2013-09-27 ENCOUNTER — Telehealth: Payer: Self-pay | Admitting: Neurology

## 2013-09-27 NOTE — Telephone Encounter (Signed)
I called the patient. The MRI of the brain and cervical cord do not show active disease and enhancement. There are lesions in the brain and cervical cord.

## 2013-10-14 ENCOUNTER — Other Ambulatory Visit: Payer: Self-pay | Admitting: Gastroenterology

## 2013-10-14 ENCOUNTER — Ambulatory Visit
Admission: RE | Admit: 2013-10-14 | Discharge: 2013-10-14 | Disposition: A | Discharge status: Home / Self Care | Payer: BC Managed Care – PPO | Source: Ambulatory Visit | Attending: Gastroenterology | Admitting: Gastroenterology

## 2013-10-14 DIAGNOSIS — R110 Nausea: Secondary | ICD-10-CM

## 2014-02-06 ENCOUNTER — Other Ambulatory Visit: Payer: Self-pay | Admitting: Neurology

## 2014-02-11 ENCOUNTER — Telehealth: Payer: Self-pay | Admitting: Neurology

## 2014-02-11 NOTE — Telephone Encounter (Signed)
Valandis from [facility] calling to request diagnosis code for patient's Rebif, please return call and advise.

## 2014-02-11 NOTE — Telephone Encounter (Signed)
Spoke to another customer service person at [employer] and relayed the patient's diagnosis code of G35 for MS.  She was going to forward the information.

## 2014-03-10 ENCOUNTER — Encounter: Payer: Self-pay | Admitting: Neurology

## 2014-03-16 ENCOUNTER — Encounter: Payer: Self-pay | Admitting: Neurology

## 2014-03-23 ENCOUNTER — Encounter: Payer: Self-pay | Admitting: Adult Health

## 2014-03-23 ENCOUNTER — Ambulatory Visit (INDEPENDENT_AMBULATORY_CARE_PROVIDER_SITE_OTHER): Payer: BC Managed Care – PPO | Admitting: Adult Health

## 2014-03-23 ENCOUNTER — Telehealth: Payer: Self-pay

## 2014-03-23 VITALS — BP 144/80 | HR 78 | Temp 98.4°F | Ht 71.5 in | Wt 192.0 lb

## 2014-03-23 DIAGNOSIS — E1142 Type 2 diabetes mellitus with diabetic polyneuropathy: Secondary | ICD-10-CM

## 2014-03-23 DIAGNOSIS — G35 Multiple sclerosis: Secondary | ICD-10-CM

## 2014-03-23 DIAGNOSIS — G629 Polyneuropathy, unspecified: Secondary | ICD-10-CM

## 2014-03-23 DIAGNOSIS — R269 Unspecified abnormalities of gait and mobility: Secondary | ICD-10-CM

## 2014-03-23 NOTE — Telephone Encounter (Signed)
I called the patient.  Provided number for PAP ([employer]) [phone].  They will contact the program and call us back if anything further is needed.

## 2014-03-23 NOTE — Patient Instructions (Signed)

## 2014-03-23 NOTE — Progress Notes (Signed)
PATIENT: Derek Collins DOB: 1950/12/03  REASON FOR VISIT: follow up HISTORY FROM: patient  HISTORY OF PRESENT ILLNESS: Mr. Derek Collins is a 63 year old male with a history of multiple sclerosis and a gait disorder. The patient returns today for follow-up. He is was on Rebif but has not been taking it due to his insurance. The patient denies any new numbness or weakness. No changes in his gait or balance. Patient does have neuropathy on was on gabapentin. His PCP decreased his dose so he is having more issues. His diabetes medication also had to be changed due to his insurance. Denies any changes with his bowels or bladder. He has had urinary frequency. He states that if his sugar isn't controlled then he really notices it.  No changes in his vision. He does have macular degeneration in the right eye. Overall the patient feels that he is doing about the same.   HISTORY 09/11/13 ([name]): 63 year old right-handed white male with a history of multiple sclerosis, diabetes, and a gait disorder. The patient remains on Rebif, and he is tolerating the medication well. The patient has had some excessive daytime drowsiness even taking Provigil 200 mg twice a day. The patient also has had a six-month history of chronic issues with nausea, and occasional vomiting. He may have sudden onset of vomiting at times. The patient is still working occasionally, but he has not worked in over 2 months. The patient has not had any recent falls, and he does not use a cane for ambulation. He reports right hip discomfort with weightbearing. He returns to this office for an evaluation. The patient takes gabapentin for his diabetic neuropathy.,  REVIEW OF SYSTEMS: Out of a complete 14 system review of symptoms, the patient complains only of the following symptoms, and all other reviewed systems are negative.  Activity change, fatigue, unexpected weight change Light sensitivity, blurred vision Hearing loss Cold intolerance,  excessive thirst Diarrhea, incontinence of bowels Restless leg, frequent waking, daytime sleepiness Incontinence of bladder, frequency of urination, urgency Weakness Walking difficulty    ALLERGIES: No Known Allergies  HOME MEDICATIONS: Outpatient Prescriptions Prior to Visit  Medication Sig Dispense Refill  . gabapentin (NEURONTIN) 400 MG capsule Take 1 capsule (400 mg total) by mouth 3 (three) times daily. 90 capsule 11  . GLIPIZIDE XL 10 MG 24 hr tablet Take 10 mg by mouth 2 (two) times daily.     . metFORMIN (GLUCOPHAGE-XR) 500 MG 24 hr tablet Take 500 mg by mouth 4 (four) times daily.     . ONE TOUCH ULTRA TEST test strip 1 each by Other route 3 (three) times daily.    Marland Kitchen. oxybutynin (DITROPAN) 5 MG tablet Take 5 mg by mouth 2 (two) times daily.    Marland Kitchen. JANUVIA 100 MG tablet Take 100 mg by mouth daily.    . modafinil (PROVIGIL) 200 MG tablet Take 200 mg by mouth 2 (two) times daily.     . permethrin (ELIMITE) 5 % cream Apply 5 g topically.    . pravastatin (PRAVACHOL) 40 MG tablet Take 40 mg by mouth daily.    Marland Kitchen. REBIF 44 MCG/0.5ML injection INJECT 44 MCG UNDER THE SKIN 3 TIMES A WEEK (Patient not taking: Reported on 03/23/2014) 18 mL 1  . Testosterone (ANDROGEL PUMP) 12.5 MG/ACT (1%) GEL Place 4 Squirts onto the skin daily.    Marland Kitchen. venlafaxine XR (EFFEXOR-XR) 75 MG 24 hr capsule Take 1 capsule (75 mg total) by mouth 2 (two) times daily. (Patient not  taking: Reported on 03/23/2014) 60 capsule 11   No facility-administered medications prior to visit.    PAST MEDICAL HISTORY: Past Medical History  Diagnosis Date  . Multiple sclerosis   . Gait disorder   . Chronic fatigue   . Chronic low back pain   . Chronic leg pain   . Renal calculi   . Depression   . Diabetes   . Diabetic peripheral neuropathy   . Obesity   . Carpal tunnel syndrome, bilateral   . Macular degeneration of left eye     PAST SURGICAL HISTORY: Past Surgical History  Procedure Laterality Date  . Rotator cuff  repair      FAMILY HISTORY: Family History  Problem Relation Age of Onset  . Stroke Father   . Cancer Sister     Breast cancer/Renal cell carcinoma    SOCIAL HISTORY: History   Social History  . Marital Status: Married    Spouse Name: N/A    Number of Children: 2  . Years of Education: 1-College   Occupational History  . unemployed Other    Scientist, physiological   Social History Main Topics  . Smoking status: Former Smoker -- 1.00 packs/day for 25 years    Types: Cigarettes  . Smokeless tobacco: Never Used  . Alcohol Use: Yes     Comment: Consumes beer on occasion  . Drug Use: No  . Sexual Activity: Not on file   Other Topics Concern  . Not on file   Social History Narrative      PHYSICAL EXAM  Filed Vitals:   03/23/14 1534  BP: 144/80  Pulse: 78  Temp: 98.4 F (36.9 C)  TempSrc: Oral  Height: 5' 11.5" (1.816 m)  Weight: 192 lb (87.091 kg)   Body mass index is 26.41 kg/(m^2).  Generalized: Well developed, in no acute distress   Neurological examination  Mentation: Alert oriented to time, place, history taking. Follows all commands speech and language fluent Cranial nerve II-XII: Pupils were equal round reactive to light. Extraocular movements were full, visual field were full on confrontational test. Facial sensation and strength were normal. Uvula tongue midline. Head turning and shoulder shrug  were normal and symmetric. Motor: The motor testing reveals 5 over 5 strength of all 4 extremities. Good symmetric motor tone is noted throughout.  Sensory: Sensory testing is intact to soft touch on all 4 extremities. No evidence of extinction is noted.  Coordination: Cerebellar testing reveals good finger-nose-finger and heel-to-shin bilaterally.  Gait and station: Gait is slightly wide-based and unsteady. He has a limping gait primarily on the right. Tandem gait not attempted. Romberg is negative but unsteady Reflexes: Deep tendon reflexes are symmetric and brisk  bilaterally   DIAGNOSTIC DATA (LABS, IMAGING, TESTING) - I reviewed patient records, labs, notes, testing and imaging myself where available.  Lab Results  Component Value Date   WBC 6.4 09/11/2013   HGB 13.9 09/11/2013   HCT 41.3 09/11/2013   MCV 89 09/11/2013   PLT 277 09/11/2013      Component Value Date/Time   NA 138 09/11/2013 1531   NA 139 10/12/2008 1112   K 5.0 09/11/2013 1531   CL 97 09/11/2013 1531   CO2 23 09/11/2013 1531   GLUCOSE 163* 09/11/2013 1531   GLUCOSE 181* 10/12/2008 1112   BUN 17 09/11/2013 1531   CREATININE 1.09 09/11/2013 1531   CALCIUM 10.8* 09/11/2013 1531   PROT 7.4 09/11/2013 1531   AST 19 09/11/2013 1531   ALT  19 09/11/2013 1531   ALKPHOS 111 09/11/2013 1531   BILITOT 0.3 09/11/2013 1531   GFRNONAA 72 09/11/2013 1531   GFRAA 84 09/11/2013 1531      ASSESSMENT AND PLAN 63 y.o. year old male  has a past medical history of Multiple sclerosis; Gait disorder; Chronic fatigue; Chronic low back pain; Chronic leg pain; Renal calculi; Depression; Diabetes; Diabetic peripheral neuropathy; Obesity; Carpal tunnel syndrome, bilateral; and Macular degeneration of left eye. here with:  1. Multiple sclerosis  2. Abnormality of gait 3. Peripheral neuropathy  The patient has not been taking his Rebif for approximately one month due to his insurance. I have sent a message to our pharmacy tech Shanda Bumps to have her look into patient assistance for the patient. She states that she will call the patient with the number to get in touch with them. If the patient does not qualify for patient assistance for rebif, we will have to look at other medication. The patient had MRI of the brain and spine in June that did not show any progression or active disease. I have advised the patient that he should use a cane when ambulating. He verbalizes understanding. If the patient's symptoms worsen or he develops new symptoms he should let us know. Otherwise he will follow-up in 4  months.   Butch Penny, MSN, NP-C 03/23/2014, 4:07 PM [facility] [address], [address] [place], [place] [address] [phone]  Note: This document was prepared with digital dictation and possible smart phrase technology. Any transcriptional errors that result from this process are unintentional.

## 2014-03-23 NOTE — Progress Notes (Signed)
I have read the note, and I agree with the clinical assessment and plan.  [name],CHARLES KEITH   

## 2014-07-23 ENCOUNTER — Telehealth: Payer: Self-pay

## 2014-07-23 ENCOUNTER — Ambulatory Visit (INDEPENDENT_AMBULATORY_CARE_PROVIDER_SITE_OTHER): Payer: BLUE CROSS/BLUE SHIELD | Admitting: Adult Health

## 2014-07-23 ENCOUNTER — Telehealth: Payer: Self-pay | Admitting: Adult Health

## 2014-07-23 ENCOUNTER — Encounter: Payer: Self-pay | Admitting: Adult Health

## 2014-07-23 VITALS — BP 143/90 | HR 79 | Ht 71.0 in | Wt 190.0 lb

## 2014-07-23 DIAGNOSIS — R269 Unspecified abnormalities of gait and mobility: Secondary | ICD-10-CM

## 2014-07-23 DIAGNOSIS — G35 Multiple sclerosis: Secondary | ICD-10-CM

## 2014-07-23 DIAGNOSIS — G63 Polyneuropathy in diseases classified elsewhere: Secondary | ICD-10-CM | POA: Diagnosis not present

## 2014-07-23 MED ORDER — GABAPENTIN 100 MG PO CAPS: ORAL_CAPSULE | ORAL | Status: DC

## 2014-07-23 MED ORDER — GABAPENTIN 400 MG PO CAPS: 400.0000 mg | ORAL_CAPSULE | Freq: Three times a day (TID) | ORAL | Status: DC

## 2014-07-23 NOTE — Telephone Encounter (Signed)
[employer] [place] has approved the request for coverage on Rebif effective until 04/22/2038 or until policy changes or is terminated Ref # [id number]

## 2014-07-23 NOTE — Patient Instructions (Signed)
Increase gabapentin to 400 mg in the morning, 500 mg at lunch and 500 mg at bedtime.  I will have our pharmacy tech to look in to what MS medication is covered under your insurance.

## 2014-07-23 NOTE — Telephone Encounter (Signed)
I contacted ins.  Rebif and Provigil both required a prior auth.  I did provide all clinical info and was able to get both meds approved on ins.  Unfortunately, they were not able to further assist with the Diabetic medication, as we would need the specific name(s), strength(s) and directions (they do not list the meds by diagnosis, only by med name).  They state the patient should have been provided with a handbook that lists all medications and coverage criteria, and said the patient could contact them if needed and they would go over this info with him.  I called the patient back.  Spoke with Derek Collins.  Relayed Rebif and Provigil have now been approved.  She states they did not get a handbook from the plan. I encouraged her to call them.  She said when they call no one ever answers.  Explained when I called I was on hold for over 20 minutes, and there may be a long hold time when calling.  She verbalized understanding and said they will call us back if anything further is needed.

## 2014-07-23 NOTE — Telephone Encounter (Signed)
Derek Collins,   This patient's insurance has changed. According to the patient a lot of his medications are no longer covered. Can we see about getting a list of medication that his insurance will cover. I am particularly interested in what multiple sclerosis therapy they will cover as well as Provigil. The patient has also not been on any diabetic medication if we can get that list as well I will forward it to his primary care provider.  Thanks!

## 2014-07-23 NOTE — Telephone Encounter (Signed)
[employer] [place] has approved the request for coverage on Modafinil effective until 07/21/2016 or until policy changes or is terminated Ref # [id number]

## 2014-07-23 NOTE — Progress Notes (Signed)
PATIENT: Derek Collins DOB: 1950/06/29  REASON FOR VISIT: follow up- multiple sclerosis HISTORY FROM: patient  HISTORY OF PRESENT ILLNESS: Derek Collins is a 64 year old male with a history of multiple sclerosis and a gait disorder. The patient returns today for follow-up. Patient denies any new numbness or weakness. Denies any significant changes with his gait or balance although he does feel like his gait is slowly getting worse over time. He denies any recent falls. Denies any changes with her bowels or bladder. He states that he does have urinary frequency unsure if this is due to the MS or his diabetes. He is on oxybutynin. The patient occasionally will have diarrhea. Apparently this has been going on for 1 year- he has followed with Dr. Randa Evens who put him on omeprazole. He states he recently has had another flare of diarrhea. The patient does have macular degeneration in the right eye. He has not followed up with his ophthalmologist. He states that he does need to make an appointment. The patient continues to not be on medication due to his insurance. He states that he did call the patient assistance for rebif but apparently he does not qualify. He also states that he used to be on Provigil and that helped with his fatigue during the day. The patient would like to start this again but is unsure if his insurance will cover it. The patient is also not on any medication for his diabetes due to his insurance. The patient also has peripheral neuropathy. He states that the gabapentin helps some but he continues to have discomfort in his feet. He states he gets worse as the day progresses. However he does state that gabapentin can sometimes cause drowsiness. He returns today for an evaluation.  HISTORY 03/23/14: Derek Collins is a 64 year old male with a history of multiple sclerosis and a gait disorder. The patient returns today for follow-up. He is was on Rebif but has not been taking it due to his insurance.  The patient denies any new numbness or weakness. No changes in his gait or balance. Patient does have neuropathy on was on gabapentin. His PCP decreased his dose so he is having more issues. His diabetes medication also had to be changed due to his insurance. Denies any changes with his bowels or bladder. He has had urinary frequency. He states that if his sugar isn't controlled then he really notices it. No changes in his vision. He does have macular degeneration in the right eye. Overall the patient feels that he is doing about the same.   HISTORY 09/11/13 ([name]): 64 year old right-handed white male with a history of multiple sclerosis, diabetes, and a gait disorder. The patient remains on Rebif, and he is tolerating the medication well. The patient has had some excessive daytime drowsiness even taking Provigil 200 mg twice a day. The patient also has had a six-month history of chronic issues with nausea, and occasional vomiting. He may have sudden onset of vomiting at times. The patient is still working occasionally, but he has not worked in over 2 months. The patient has not had any recent falls, and he does not use a cane for ambulation. He reports right hip discomfort with weightbearing. He returns to this office for an evaluation. The patient takes gabapentin for his diabetic neuropathy.,  REVIEW OF SYSTEMS: Out of a complete 14 system review of symptoms, the patient complains only of the following symptoms, and all other reviewed systems are negative.  Cold intolerance, heat  intolerance, diarrhea, restless leg, daytime sleepiness, walking difficulty, frequency of urination, weakness  ALLERGIES: No Known Allergies  HOME MEDICATIONS: Outpatient Prescriptions Prior to Visit  Medication Sig Dispense Refill  . gabapentin (NEURONTIN) 400 MG capsule Take 1 capsule (400 mg total) by mouth 3 (three) times daily. 90 capsule 11  . GLIPIZIDE XL 10 MG 24 hr tablet Take 10 mg by mouth 2 (two) times daily.      . metFORMIN (GLUCOPHAGE-XR) 500 MG 24 hr tablet Take 500 mg by mouth 4 (four) times daily.     . ONE TOUCH ULTRA TEST test strip 1 each by Other route 3 (three) times daily.    Marland Kitchen oxybutynin (DITROPAN) 5 MG tablet Take 5 mg by mouth 2 (two) times daily.    . permethrin (ELIMITE) 5 % cream Apply 5 g topically.    . pravastatin (PRAVACHOL) 40 MG tablet Take 40 mg by mouth daily.    Marland Kitchen REBIF 44 MCG/0.5ML injection INJECT 44 MCG UNDER THE SKIN 3 TIMES A WEEK 18 mL 1  . JANUVIA 100 MG tablet Take 100 mg by mouth daily.    . modafinil (PROVIGIL) 200 MG tablet Take 200 mg by mouth 2 (two) times daily.     . Testosterone (ANDROGEL PUMP) 12.5 MG/ACT (1%) GEL Place 4 Squirts onto the skin daily.    Marland Kitchen venlafaxine XR (EFFEXOR-XR) 75 MG 24 hr capsule Take 1 capsule (75 mg total) by mouth 2 (two) times daily. (Patient not taking: Reported on 03/23/2014) 60 capsule 11   No facility-administered medications prior to visit.    PAST MEDICAL HISTORY: Past Medical History  Diagnosis Date  . Multiple sclerosis   . Gait disorder   . Chronic fatigue   . Chronic low back pain   . Chronic leg pain   . Renal calculi   . Depression   . Diabetes   . Diabetic peripheral neuropathy   . Obesity   . Carpal tunnel syndrome, bilateral   . Macular degeneration of left eye     PAST SURGICAL HISTORY: Past Surgical History  Procedure Laterality Date  . Hernia repair    . Tonsillectomy      FAMILY HISTORY: Family History  Problem Relation Age of Onset  . Stroke Father   . Cancer Sister     Breast cancer/Renal cell carcinoma  . Kidney failure Mother   . High blood pressure Mother     SOCIAL HISTORY: History   Social History  . Marital Status: Married    Spouse Name: N/A  . Number of Children: 2  . Years of Education: 1-College   Occupational History  . unemployed Other    Scientist, physiological   Social History Main Topics  . Smoking status: Former Smoker -- 1.00 packs/day for 25 years    Types:  Cigarettes  . Smokeless tobacco: Never Used     Comment: Quit 30 years ago.  . Alcohol Use: 0.0 oz/week    0 Standard drinks or equivalent per week     Comment: Consumes beer on occasion  . Drug Use: No  . Sexual Activity: Not on file   Other Topics Concern  . Not on file   Social History Narrative   Patient lives at home with his wife [name].    Patient is disabled.   Education high school two years vocational.   Left handed.   Caffeine two soda's daily.      PHYSICAL EXAM  Filed Vitals:   07/23/14 1051  BP: 143/90  Pulse: 79  Height: 5\' 11"  (1.803 m)  Weight: 190 lb (86.183 kg)   Body mass index is 26.51 kg/(m^2).  Generalized: Well developed, in no acute distress   Neurological examination  Mentation: Alert oriented to time, place, history taking. Follows all commands speech and language fluent Cranial nerve II-XII: Pupils were equal round reactive to light. Extraocular movements were full, visual field were full on confrontational test. Facial sensation and strength were normal. Uvula tongue midline. Head turning and shoulder shrug  were normal and symmetric. Motor: The motor testing reveals 5 over 5 strength of all 4 extremities. Good symmetric motor tone is noted throughout.  Sensory: Sensory testing is intact to soft touch on all 4 extremities. No evidence of extinction is noted.  Coordination: Cerebellar testing reveals good finger-nose-finger and heel-to-shin bilaterally.  Gait and station: Patient's gait is wide-based and unsteady. He also has a limp on the right. He does not use any assistive device to ambulate. Unable to complete tandem gait. Romberg is negative but unsteady..  Reflexes: Deep tendon reflexes are symmetric and normal bilaterally.     DIAGNOSTIC DATA (LABS, IMAGING, TESTING) - I reviewed patient records, labs, notes, testing and imaging myself where available.     ASSESSMENT AND PLAN 64 y.o. year old male  has a past medical history of  Multiple sclerosis; Gait disorder; Chronic fatigue; Chronic low back pain; Chronic leg pain; Renal calculi; Depression; Diabetes; Diabetic peripheral neuropathy; Obesity; Carpal tunnel syndrome, bilateral; and Macular degeneration of left eye. here with:  1. Multiple sclerosis 2. Abnormality of gait 3. Peripheral neuropathy  The patient has been stable in regards to any exacerbations from multiple sclerosis. However it is important that he start back on disease modifying therapy. I explained to the patient that if he is unable to afford Rebif then we need to consider other medication. The patient is amenable to this. I will have our pharmacy tech call his insurance to see if we can find out what medications they do cover. The patient would also like to start back Provigil for fatigue- again I will find out if his insurance covers this before I order it. The patient continues to have discomfort in his feet from peripheral neuropathy. I will increase his gabapentin. He will continue taking 400 mg in the morning but we will increase his lunchtime and bedtime dose to 500 mg. The patient will let us know if this is beneficial. I have advised the patient that if his symptoms worsen or he develops new symptoms he should let us know. Otherwise he will follow-up in 3-4 months with Dr. Anne Hahn.    Butch Penny, MSN, NP-C 07/23/2014, 11:22 AM [facility] [address] [place], [place] [address] [phone]  Note: This document was prepared with digital dictation and possible smart phrase technology. Any transcriptional errors that result from this process are unintentional.

## 2014-07-23 NOTE — Progress Notes (Signed)
I have read the note, and I agree with the clinical assessment and plan.  Apphia Cropley KEITH   

## 2014-07-26 ENCOUNTER — Other Ambulatory Visit: Payer: Self-pay

## 2014-07-26 MED ORDER — MODAFINIL 100 MG PO TABS: 100.0000 mg | ORAL_TABLET | Freq: Every day | ORAL | Status: DC

## 2014-07-26 NOTE — Telephone Encounter (Signed)
Rx added and sent.  Patient is aware.

## 2014-07-26 NOTE — Telephone Encounter (Signed)
Since Rebif is already on the list, if you could just advise what dose of Provigil you'd like, I can certainly add that order.  (Ins has approved a max dose of  one twice daily.)

## 2014-07-26 NOTE — Telephone Encounter (Signed)
Thanks Shanda Bumps. Do I need to place orders for rebif or provigil?

## 2014-07-26 NOTE — Telephone Encounter (Signed)
Lets start at provigil100 mg daily to make sure he can tolerate without any side effects. If needed we can increase the dose in the future.

## 2014-07-26 NOTE — Telephone Encounter (Signed)
Rx entered with instruction per previous note.  Patient aware.  They would like Rx sent to [facility].

## 2014-07-27 ENCOUNTER — Telehealth: Payer: Self-pay

## 2014-07-27 NOTE — Telephone Encounter (Signed)
The Rebif rep asked the reimbursement specialist, Victorino Dike, from [employer] to contact me regarding this patient's situation.  I spoke with her at great length.  She reviewed the patient's file and said the patient has not contacted them since 2014 regarding assistance.  She said at that time, they attempted to reach the patient on several occasions via phone, and written letters. Indicates they never got a response from the patient.  I verified the contact info we have on file, and it does match their records.  Said she will gladly try to reach the patient regarding assistance options, however, if the patient does not respond to them, they cannot provide any help.  I thanked her for checking into this.  Her direct number for any future questions is [phone].  I called the patient to advise [employer] would be calling.  Got no answer.  Left message relaying they will be calling, and it is imperative that they speak with them if assistance is needed, as they cannot help without speaking to patient.

## 2014-08-17 ENCOUNTER — Telehealth: Payer: Self-pay | Admitting: Neurology

## 2014-08-17 NOTE — Telephone Encounter (Signed)
[employer] has tried to rich patient about help with his drug copays but they have been unsuccessful in contacting him.  Patient should call [employer] at [phone] if he still needs help.

## 2014-08-17 NOTE — Telephone Encounter (Signed)
[employer] has attempted to reach the patient multiple times regarding co-pay assist.  The patient has not responded to any of their calls.  I called the patient.  Got no answer.  Left message relaying they will need to call [employer] back at [phone] in order to try and obtain assistance with co-pay.

## 2014-11-25 ENCOUNTER — Ambulatory Visit (INDEPENDENT_AMBULATORY_CARE_PROVIDER_SITE_OTHER): Payer: BLUE CROSS/BLUE SHIELD | Admitting: Neurology

## 2014-11-25 ENCOUNTER — Encounter: Payer: Self-pay | Admitting: Neurology

## 2014-11-25 VITALS — BP 131/76 | HR 59 | Ht 72.0 in | Wt 188.6 lb

## 2014-11-25 DIAGNOSIS — R269 Unspecified abnormalities of gait and mobility: Secondary | ICD-10-CM | POA: Diagnosis not present

## 2014-11-25 DIAGNOSIS — G35 Multiple sclerosis: Secondary | ICD-10-CM | POA: Diagnosis not present

## 2014-11-25 MED ORDER — MODAFINIL 200 MG PO TABS: 200.0000 mg | ORAL_TABLET | Freq: Every day | ORAL | Status: DC

## 2014-11-25 NOTE — Patient Instructions (Addendum)
   We will increase the Provigil to 200 mg a day. Please call [facility] at [phone] to start the Rebif.    Multiple Sclerosis Multiple sclerosis (MS) is a disease of the central nervous system. It leads to the loss of the insulating covering of the nerves (myelin sheath) of your brain. When this happens, brain signals do not get sent properly or may not get sent at all. The age of onset of MS varies.  CAUSES The cause of MS is unknown. However, it is more common in the [place] than in the [place]. RISK FACTORS There is a higher number of women with MS than men. MS is not an illness that is passed down to you from your family members (inherited). However, your risk of MS is higher if you have a relative with MS. SIGNS AND SYMPTOMS  The symptoms of MS occur in episodes or attacks. These attacks may last weeks to months. There may be long periods of almost no symptoms between attacks. The symptoms of MS vary. This is because of the many different ways it affects the central nervous system. The main symptoms of MS include:  Vision problems and eye pain.  Numbness.  Weakness.  Inability to move your arms, hands, feet, or legs (paralysis).  Balance problems.  Tremors. DIAGNOSIS  Your health care provider can diagnose MS with the help of imaging exams and lab tests. These may include specialized X-ray exams and spinal fluid tests. The best imaging exam to confirm a diagnosis of MS is an MRI. TREATMENT  There is no known cure for MS, but there are medicines that can decrease the number and frequency of attacks. Steroids are often used for short-term relief. Physical and occupational therapy may also help. There are also many new alternative or complementary treatments available to help control the symptoms of MS. Ask your health care provider if any of these other options are right for you. HOME CARE INSTRUCTIONS   Take medicines as directed by your health  care provider.  Exercise as directed by your health care provider. SEEK MEDICAL CARE IF: You begin to feel depressed. SEEK IMMEDIATE MEDICAL CARE IF:  You develop paralysis.  You have problems with bladder, bowel, or sexual function.  You develop mental changes, such as forgetfulness or mood swings.  You have a period of uncontrolled movements (seizure). Document Released: 04/06/2000 Document Revised: 04/14/2013 Document Reviewed: 12/15/2012 [facility] Patient Information 2015 [place], [place]. This information is not intended to replace advice given to you by your health care provider. Make sure you discuss any questions you have with your health care provider.

## 2014-11-25 NOTE — Progress Notes (Signed)
Reason for visit: Multiple sclerosis  Derek Collins is an 64 y.o. male  History of present illness:  Derek Collins is a 64 year old right-handed white male with a history of multiple sclerosis, diabetes, diabetic peripheral neuropathy, and an associated gait disorder. The patient has not been on any disease modifying agents. The patient was to start Rebif, but he never would call back the [facility] nurse to get the medication started. The patient is on Provigil, he is getting some benefit with this. He has some worsening balance issues and gait problems in part from some right hip arthritis. He denies any falls. He is retired at this point, he last worked in September 2015. The patient is getting some blood work in the near future through his primary care physician. He denies any new numbness, weakness, vision changes, speech changes, or difficulty with bowel or bladder control. He returns for an evaluation.  Past Medical History  Diagnosis Date  . Multiple sclerosis   . Gait disorder   . Chronic fatigue   . Chronic low back pain   . Chronic leg pain   . Renal calculi   . Depression   . Diabetes   . Diabetic peripheral neuropathy   . Obesity   . Carpal tunnel syndrome, bilateral   . Macular degeneration of left eye     Past Surgical History  Procedure Laterality Date  . Hernia repair    . Tonsillectomy      Family History  Problem Relation Age of Onset  . Stroke Father   . Cancer Sister     Breast cancer/Renal cell carcinoma  . Kidney failure Mother   . High blood pressure Mother     Social history:  reports that he has quit smoking. His smoking use included Cigarettes. He has a 25 pack-year smoking history. He has never used smokeless tobacco. He reports that he does not drink alcohol or use illicit drugs.   No Known Allergies  Medications:  Prior to Admission medications   Medication Sig Start Date End Date Taking? Authorizing Provider  gabapentin (NEURONTIN) 100 MG  capsule Take 1 tablet at  [facility] and at bedtime with 400 mg tablet. 07/23/14  Yes Butch Penny, NP  gabapentin (NEURONTIN) 400 MG capsule Take 1 capsule (400 mg total) by mouth 3 (three) times daily. 07/23/14  Yes Butch Penny, NP  GLIPIZIDE XL 10 MG 24 hr tablet Take 10 mg by mouth 2 (two) times daily.  06/26/12  Yes Historical Provider, MD  metFORMIN (GLUCOPHAGE-XR) 500 MG 24 hr tablet Take 500 mg by mouth 4 (four) times daily.  07/20/12  Yes Historical Provider, MD  modafinil (PROVIGIL) 100 MG tablet Take 1 tablet (100 mg total) by mouth daily. 07/26/14  Yes Butch Penny, NP  ONE TOUCH ULTRA TEST test strip 1 each by Other route 3 (three) times daily. 07/01/13  Yes Historical Provider, MD  oxybutynin (DITROPAN) 5 MG tablet Take 5 mg by mouth 2 (two) times daily. 08/26/12  Yes Historical Provider, MD  pravastatin (PRAVACHOL) 40 MG tablet Take 40 mg by mouth daily. 08/06/13  Yes Historical Provider, MD  UNABLE TO FIND Take 1 tablet by mouth daily. Med Name: Occuvite   Yes Historical Provider, MD    ROS:  Out of a complete 14 system review of symptoms, the patient complains only of the following symptoms, and all other reviewed systems are negative.  Fatigue Blurred vision Cold intolerance, heat intolerance Diarrhea Daytime sleepiness, snoring Frequency of  urination, urgency Joint pain, muscle cramps, walking difficulty Bruising easily Dizziness, weakness Agitation  Blood pressure 131/76, pulse 59, height 6' (1.829 m), weight 188 lb 9.6 oz (85.548 kg).  Physical Exam  General: The patient is alert and cooperative at the time of the examination.  Skin: No significant peripheral edema is noted.   Neurologic Exam  Mental status: The patient is alert and oriented x 3 at the time of the examination. The patient has apparent normal recent and remote memory, with an apparently normal attention span and concentration ability.   Cranial nerves: Facial symmetry is present. Speech is normal, no  aphasia or dysarthria is noted. Extraocular movements are full. Visual fields are full.  Motor: The patient has good strength in all 4 extremities, with exception of some weakness involving the intrinsic muscles of the hands, and 4/5 strength proximally in the legs, right slightly weaker than the left.  Sensory examination: Soft touch sensation is symmetric on the face, arms, and legs.  Coordination: The patient has good finger-nose-finger and heel-to-shin bilaterally, with exception of some slight ataxia with finger-nose-finger with the right arm.  Gait and station: The patient has a wide-based, limping type gait on the right leg. Tandem gait was not attempted. Romberg is negative. No drift is seen.  Reflexes: Deep tendon reflexes are symmetric, but are depressed .   Assessment/Plan:  1. Multiple sclerosis  2. Gait disorder  3. Chronic fatigue  4. Diabetes, diabetic peripheral neuropathy  The patient will be increased on the Provigil taking 200 mg daily. He will call the [facility] nurse, the telephone number was given to him. He is to get the Rebif started. He will follow-up in 6 months. We will need to get blood work at that time.  Marlan Palau MD 11/25/2014 8:28 PM  [facility] [address] [place], [place] [address]  Phone [phone] Fax [phone]

## 2014-12-16 ENCOUNTER — Telehealth: Payer: Self-pay

## 2014-12-16 NOTE — Telephone Encounter (Signed)
The patient called back. I actually do not need his signature, according to Thomasena Edis who came by to check on the status of the Rebif Rx. I advised him that I sent the paperwork to [facility] for his Rebif Rx and he should hear something from them soon.

## 2014-12-16 NOTE — Telephone Encounter (Signed)
I returned the patient's call. Left voicemail asking him to call me back.

## 2014-12-16 NOTE — Telephone Encounter (Signed)
I called the patient and left a voicemail asking him to call me back. I have a form I need him to sign for his Rebif.

## 2014-12-16 NOTE — Telephone Encounter (Signed)
Patient called returning Kelby's call. °

## 2015-01-24 ENCOUNTER — Other Ambulatory Visit: Payer: Self-pay | Admitting: Adult Health

## 2015-06-01 ENCOUNTER — Ambulatory Visit (INDEPENDENT_AMBULATORY_CARE_PROVIDER_SITE_OTHER): Payer: BLUE CROSS/BLUE SHIELD | Admitting: Neurology

## 2015-06-01 ENCOUNTER — Encounter: Payer: Self-pay | Admitting: Neurology

## 2015-06-01 VITALS — BP 129/78 | HR 82 | Ht 72.0 in | Wt 183.5 lb

## 2015-06-01 DIAGNOSIS — G8929 Other chronic pain: Secondary | ICD-10-CM | POA: Diagnosis not present

## 2015-06-01 DIAGNOSIS — M545 Low back pain: Secondary | ICD-10-CM

## 2015-06-01 DIAGNOSIS — E1142 Type 2 diabetes mellitus with diabetic polyneuropathy: Secondary | ICD-10-CM | POA: Diagnosis not present

## 2015-06-01 DIAGNOSIS — G35 Multiple sclerosis: Secondary | ICD-10-CM

## 2015-06-01 DIAGNOSIS — R269 Unspecified abnormalities of gait and mobility: Secondary | ICD-10-CM | POA: Diagnosis not present

## 2015-06-01 MED ORDER — DULOXETINE HCL 30 MG PO CPEP: ORAL_CAPSULE | ORAL | Status: DC

## 2015-06-01 NOTE — Progress Notes (Signed)
Reason for visit: Multiple sclerosis  Derek Collins is an 65 y.o. male  History of present illness:  Derek Collins is a 65 year old right-handed white male with a history of multiple sclerosis and diabetes. The patient likely has some degree of a diabetic peripheral neuropathy. He reports discomfort in the feet, he believes that the foot pain does prevent him from walking long distances. The patient has pain at nighttime as well, but the pain does not keep him awake. He reports some progressive issues with weakness of the right hand. He is on Rebif at this point, tolerating medication well. He reports some dizziness in the mornings when he first gets out of bed. He has not had any faints or falls because of the dizziness, if he sits down the dizziness improves. The patient is on oxybutynin for his bladder, this seems to work fairly well. The patient has occasional episodes of diarrhea. The patient denies any changes in vision. He does report some balance issues, no recent falls, he may occasionally use a cane for ambulation. He returns to this office for an evaluation.  Past Medical History  Diagnosis Date  . Multiple sclerosis (HCC)   . Gait disorder   . Chronic fatigue   . Chronic low back pain   . Chronic leg pain   . Renal calculi   . Depression   . Diabetes (HCC)   . Diabetic peripheral neuropathy (HCC)   . Obesity   . Carpal tunnel syndrome, bilateral   . Macular degeneration of left eye     Past Surgical History  Procedure Laterality Date  . Hernia repair    . Tonsillectomy      Family History  Problem Relation Age of Onset  . Stroke Father   . Cancer Sister     Breast cancer/Renal cell carcinoma  . Kidney failure Mother   . High blood pressure Mother     Social history:  reports that he has quit smoking. His smoking use included Cigarettes. He has a 25 pack-year smoking history. He has never used smokeless tobacco. He reports that he does not drink alcohol or use  illicit drugs.   No Known Allergies  Medications:  Prior to Admission medications   Medication Sig Start Date End Date Taking? Authorizing Provider  gabapentin (NEURONTIN) 100 MG capsule TAKE 1 CAPSULE BY MOUTH TWICE A DAY AT 12 NOON AND AT BEDTIME WITH A 400MG  GABAPENTIN CAPSULE 01/24/15  Yes Butch Penny, NP  gabapentin (NEURONTIN) 400 MG capsule Take 1 capsule (400 mg total) by mouth 3 (three) times daily. 07/23/14  Yes Butch Penny, NP  GLIPIZIDE XL 10 MG 24 hr tablet Take 10 mg by mouth 2 (two) times daily.  06/26/12  Yes Historical Provider, MD  lisinopril (PRINIVIL,ZESTRIL) 5 MG tablet Take 5 mg by mouth daily. 12/13/14 12/13/15 Yes Historical Provider, MD  metFORMIN (GLUCOPHAGE-XR) 500 MG 24 hr tablet Take 500 mg by mouth 4 (four) times daily.  07/20/12  Yes Historical Provider, MD  modafinil (PROVIGIL) 200 MG tablet Take 1 tablet (200 mg total) by mouth daily. 11/25/14  Yes York Spaniel, MD  ONE TOUCH ULTRA TEST test strip 1 each by Other route 3 (three) times daily. 07/01/13  Yes Historical Provider, MD  oxybutynin (DITROPAN) 5 MG tablet Take 5 mg by mouth 2 (two) times daily. 08/26/12  Yes Historical Provider, MD  pravastatin (PRAVACHOL) 40 MG tablet Take 40 mg by mouth daily. 08/06/13  Yes Historical Provider, MD  UNABLE TO FIND Take 1 tablet by mouth daily. Med Name: Occuvite   Yes Historical Provider, MD    ROS:  Out of a complete 14 system review of symptoms, the patient complains only of the following symptoms, and all other reviewed systems are negative.  Fatigue Light sensitivity Cold intolerance, heat intolerance Daytime sleepiness Frequency of urination, urinary urgency Walking difficulty Dizziness, weakness  Blood pressure 129/78, pulse 82, height 6' (1.829 m), weight 183 lb 8 oz (83.235 kg).   Blood pressure, right arm, standing is 158/92. Blood pressure sitting, right arm, is 152/90.  Physical Exam  General: The patient is alert and cooperative at the time of the  examination.  Skin: No significant peripheral edema is noted.   Neurologic Exam  Mental status: The patient is alert and oriented x 3 at the time of the examination. The patient has apparent normal recent and remote memory, with an apparently normal attention span and concentration ability.   Cranial nerves: Facial symmetry is present. Speech is normal, no aphasia or dysarthria is noted. Extraocular movements are full. Visual fields are full. Pupils are equal, round, and reactive to light. Discs are flat bilaterally.  Motor: The patient has good strength in all 4 extremities, with exception of some 4/5 strength with intrinsic muscles of the right hand, weakness with grip.  Sensory examination: Soft touch sensation is symmetric on the face, arms, and legs.  Coordination: The patient has good finger-nose-finger and heel-to-shin bilaterally.  Gait and station: The patient has a wide-based, unsteady gait. Tandem gait was not attempted. Romberg is negative. No drift is seen.  Reflexes: Deep tendon reflexes are symmetric, are well-maintained throughout including ankle jerk reflexes.   Assessment/Plan:  1. Multiple sclerosis  2. Diabetes, diabetic peripheral neuropathy  3. Gait disorder  The patient is having some issues with chronic discomfort in the feet. We will add Cymbalta to his regimen. He will remain on the Rebif, we will check blood work on the next visit. He will follow-up in 6 months. Much of his pain and weakness on clinical examination is likely related to the multiple sclerosis, the patient has spinal cord lesions by MRI in 2015. This may be causing some of the pain and some of the right hand weakness. The patient is to remain on gabapentin.  Marlan Palau MD 06/01/2015 6:07 PM  [facility] [address] [place], [place] [address]  Phone [phone] Fax [phone]

## 2015-06-01 NOTE — Patient Instructions (Signed)
Multiple Sclerosis °Multiple sclerosis (MS) is a disease of the central nervous system. It leads to the loss of the insulating covering of the nerves (myelin sheath) of your brain. When this happens, brain signals do not get sent properly or may not get sent at all. The age of onset of MS varies.  °CAUSES °The cause of MS is unknown. However, it is more common in the [place] than in the [place]. °RISK FACTORS °There is a higher number of women with MS than men. MS is not an illness that is passed down to you from your family members (inherited). However, your risk of MS is higher if you have a relative with MS. °SIGNS AND SYMPTOMS  °The symptoms of MS occur in episodes or attacks. These attacks may last weeks to months. There may be long periods of almost no symptoms between attacks. The symptoms of MS vary. This is because of the many different ways it affects the central nervous system. The main symptoms of MS include: °· Vision problems and eye pain. °· Numbness. °· Weakness. °· Inability to move your arms, hands, feet, or legs (paralysis). °· Balance problems. °· Tremors. °DIAGNOSIS  °Your health care provider can diagnose MS with the help of imaging exams and lab tests. These may include specialized X-ray exams and spinal fluid tests. The best imaging exam to confirm a diagnosis of MS is an MRI. °TREATMENT  °There is no known cure for MS, but there are medicines that can decrease the number and frequency of attacks. Steroids are often used for short-term relief. Physical and occupational therapy may also help. There are also many new alternative or complementary treatments available to help control the symptoms of MS. Ask your health care provider if any of these other options are right for you. °HOME CARE INSTRUCTIONS  °· Take medicines as directed by your health care provider. °· Exercise as directed by your health care provider. °SEEK MEDICAL CARE IF: °You begin to feel  depressed. °SEEK IMMEDIATE MEDICAL CARE IF: °· You develop paralysis. °· You have problems with bladder, bowel, or sexual function. °· You develop mental changes, such as forgetfulness or mood swings. °· You have a period of uncontrolled movements (seizure). °  °This information is not intended to replace advice given to you by your health care provider. Make sure you discuss any questions you have with your health care provider. °  °Document Released: 04/06/2000 Document Revised: 04/14/2013 Document Reviewed: 12/15/2012 °Elsevier Interactive Patient Education ©2016 [employer] Inc[employer] ° °

## 2015-06-23 ENCOUNTER — Other Ambulatory Visit: Payer: Self-pay | Admitting: *Deleted

## 2015-06-23 MED ORDER — INTERFERON BETA-1A 44 MCG/0.5ML ~~LOC~~ SOAJ: 44.0000 ug | SUBCUTANEOUS | Status: DC

## 2015-11-29 ENCOUNTER — Ambulatory Visit (INDEPENDENT_AMBULATORY_CARE_PROVIDER_SITE_OTHER): Payer: BLUE CROSS/BLUE SHIELD | Admitting: Neurology

## 2015-11-29 ENCOUNTER — Encounter: Payer: Self-pay | Admitting: Neurology

## 2015-11-29 VITALS — BP 137/91 | HR 97 | Ht 72.0 in | Wt 178.0 lb

## 2015-11-29 DIAGNOSIS — E1142 Type 2 diabetes mellitus with diabetic polyneuropathy: Secondary | ICD-10-CM

## 2015-11-29 DIAGNOSIS — G35 Multiple sclerosis: Secondary | ICD-10-CM

## 2015-11-29 DIAGNOSIS — R269 Unspecified abnormalities of gait and mobility: Secondary | ICD-10-CM | POA: Diagnosis not present

## 2015-11-29 MED ORDER — OXYBUTYNIN CHLORIDE 5 MG PO TABS: 5.0000 mg | ORAL_TABLET | Freq: Three times a day (TID) | ORAL | 1 refills | Status: DC

## 2015-11-29 NOTE — Patient Instructions (Addendum)
Fall Prevention in the Home  Falls can cause injuries and can affect people from all age groups. There are many simple things that you can do to make your home safe and to help prevent falls. WHAT CAN I DO ON THE OUTSIDE OF MY HOME?  Regularly repair the edges of walkways and driveways and fix any cracks.  Remove high doorway thresholds.  Trim any shrubbery on the main path into your home.  Use bright outdoor lighting.  Clear walkways of debris and clutter, including tools and rocks.  Regularly check that handrails are securely fastened and in good repair. Both sides of any steps should have handrails.  Install guardrails along the edges of any raised decks or porches.  Have leaves, snow, and ice cleared regularly.  Use sand or salt on walkways during winter months.  In the garage, clean up any spills right away, including grease or oil spills. WHAT CAN I DO IN THE BATHROOM?  Use night lights.  Install grab bars by the toilet and in the tub and shower. Do not use towel bars as grab bars.  Use non-skid mats or decals on the floor of the tub or shower.  If you need to sit down while you are in the shower, use a plastic, non-slip stool..  Keep the floor dry. Immediately clean up any water that spills on the floor.  Remove soap buildup in the tub or shower on a regular basis.  Attach bath mats securely with double-sided non-slip rug tape.  Remove throw rugs and other tripping hazards from the floor. WHAT CAN I DO IN THE BEDROOM?  Use night lights.  Make sure that a bedside light is easy to reach.  Do not use oversized bedding that drapes onto the floor.  Have a firm chair that has side arms to use for getting dressed.  Remove throw rugs and other tripping hazards from the floor. WHAT CAN I DO IN THE KITCHEN?   Clean up any spills right away.  Avoid walking on wet floors.  Place frequently used items in easy-to-reach places.  If you need to reach for something  above you, use a sturdy step stool that has a grab bar.  Keep electrical cables out of the way.  Do not use floor polish or wax that makes floors slippery. If you have to use wax, make sure that it is non-skid floor wax.  Remove throw rugs and other tripping hazards from the floor. WHAT CAN I DO IN THE STAIRWAYS?  Do not leave any items on the stairs.  Make sure that there are handrails on both sides of the stairs. Fix handrails that are broken or loose. Make sure that handrails are as long as the stairways.  Check any carpeting to make sure that it is firmly attached to the stairs. Fix any carpet that is loose or worn.  Avoid having throw rugs at the top or bottom of stairways, or secure the rugs with carpet tape to prevent them from moving.  Make sure that you have a light switch at the top of the stairs and the bottom of the stairs. If you do not have them, have them installed. WHAT ARE SOME OTHER FALL PREVENTION TIPS?  Wear closed-toe shoes that fit well and support your feet. Wear shoes that have rubber soles or low heels.  When you use a stepladder, make sure that it is completely opened and that the sides are firmly locked. Have someone hold the ladder while you   are using it. Do not climb a closed stepladder.  Add color or contrast paint or tape to grab bars and handrails in your home. Place contrasting color strips on the first and last steps.  Use mobility aids as needed, such as canes, walkers, scooters, and crutches.  Turn on lights if it is dark. Replace any light bulbs that burn out.  Set up furniture so that there are clear paths. Keep the furniture in the same spot.  Fix any uneven floor surfaces.  Choose a carpet design that does not hide the edge of steps of a stairway.  Be aware of any and all pets.  Review your medicines with your healthcare provider. Some medicines can cause dizziness or changes in blood pressure, which increase your risk of falling. Talk  with your health care provider about other ways that you can decrease your risk of falls. This may include working with a physical therapist or trainer to improve your strength, balance, and endurance.   This information is not intended to replace advice given to you by your health care provider. Make sure you discuss any questions you have with your health care provider.   Document Released: 03/30/2002 Document Revised: 08/24/2014 Document Reviewed: 05/14/2014 Elsevier Interactive Patient Education 2016 [employer]  

## 2015-11-29 NOTE — Progress Notes (Signed)
Reason for visit: Multiple sclerosis  Derek Collins is an 65 y.o. male  History of present illness:  Mr. Klinker is a 66 year old right-handed white male with a history of multiple sclerosis. The patient is on Rebif, he is tolerating the medication fairly well. He denies any new numbness or weakness of the extremities, but he does have some gradually progressive issues with walking. The patient has diabetes and he has numbness in the legs and feet up to the knees. The patient also reports some issues controlling the bladder. He has had urgency and frequency and incontinence. The patient has been placed on Flomax with some benefit with the ability to empty the bladder, but he still has a lot of urgency problems. The patient has had fatigue issues, he is been placed on Provigil, he stopped the medication about a week ago but he has not noted any change in the level of fatigue. The patient is on testosterone supplementation, he has had some benefit with fatigue on this medication. The patient has had an occasional fall. He does not use a cane or walker for ambulation. He denies any changes in vision, speech, or swallowing.  Past Medical History:  Diagnosis Date  . Carpal tunnel syndrome, bilateral   . Chronic fatigue   . Chronic leg pain   . Chronic low back pain   . Depression   . Diabetes (HCC)   . Diabetic peripheral neuropathy (HCC)   . Gait disorder   . Macular degeneration of left eye   . Multiple sclerosis (HCC)   . Obesity   . Renal calculi   . Urinary frequency     Past Surgical History:  Procedure Laterality Date  . HERNIA REPAIR    . TONSILLECTOMY      Family History  Problem Relation Age of Onset  . Stroke Father   . Kidney failure Mother   . High blood pressure Mother   . Cancer Sister     Breast cancer/Renal cell carcinoma    Social history:  reports that he has quit smoking. His smoking use included Cigarettes. He has a 25.00 pack-year smoking history. He has  never used smokeless tobacco. He reports that he does not drink alcohol or use drugs.   No Known Allergies  Medications:  Prior to Admission medications   Medication Sig Start Date End Date Taking? Authorizing Provider  DULoxetine (CYMBALTA) 60 MG capsule Take 60 mg by mouth daily.  06/13/15 06/12/16 Yes Historical Provider, MD  gabapentin (NEURONTIN) 400 MG capsule Take 1 capsule (400 mg total) by mouth 3 (three) times daily. 07/23/14  Yes Butch Penny, NP  GLIPIZIDE XL 10 MG 24 hr tablet Take 10 mg by mouth 2 (two) times daily.  06/26/12  Yes Historical Provider, MD  Interferon Beta-1a (REBIF REBIDOSE) 44 MCG/0.5ML SOAJ Inject 44 mcg into the skin 3 (three) times a week. 06/23/15  Yes York Spaniel, MD  lisinopril (PRINIVIL,ZESTRIL) 5 MG tablet Take 5 mg by mouth daily. 12/13/14 12/13/15 Yes Historical Provider, MD  metFORMIN (GLUCOPHAGE-XR) 500 MG 24 hr tablet Take 500 mg by mouth 4 (four) times daily.  07/20/12  Yes Historical Provider, MD  modafinil (PROVIGIL) 200 MG tablet Take 1 tablet (200 mg total) by mouth daily. 11/25/14  Yes York Spaniel, MD  ONE TOUCH ULTRA TEST test strip 1 each by Other route 3 (three) times daily. 07/01/13  Yes Historical Provider, MD  pravastatin (PRAVACHOL) 40 MG tablet Take 40 mg by mouth  daily. 08/06/13  Yes Historical Provider, MD  tamsulosin (FLOMAX) 0.4 MG CAPS capsule Take by mouth. 11/17/15  Yes Historical Provider, MD  UNABLE TO FIND Take 1 tablet by mouth daily. Med Name: Occuvite   Yes Historical Provider, MD    ROS:  Out of a complete 14 system review of symptoms, the patient complains only of the following symptoms, and all other reviewed systems are negative.  Daytime sleepiness, snoring Frequency of urination, urgency of the bladder Walking difficulty Numbness in the feet, weakness  Blood pressure (!) 137/91, pulse 97, height 6' (1.829 m), weight 178 lb (80.7 kg).  Physical Exam  General: The patient is alert and cooperative at the time of  the examination.  Skin: No significant peripheral edema is noted.   Neurologic Exam  Mental status: The patient is alert and oriented x 3 at the time of the examination. The patient has apparent normal recent and remote memory, with an apparently normal attention span and concentration ability.   Cranial nerves: Facial symmetry is present. Speech is normal, no aphasia or dysarthria is noted. Extraocular movements are full. Visual fields are full. Pupils are equal, round, and reactive to light. Discs are flat bilaterally.  Motor: The patient has good strength in all 4 extremities, with exception of some slight intrinsic muscle weakness of the hands bilaterally.  Sensory examination: Soft touch sensation is symmetric on the face, arms, and legs. The patient has a stocking pattern pinprick sensory deficit up to the knees bilaterally.  Coordination: The patient has good finger-nose-finger and heel-to-shin bilaterally.  Gait and station: The patient has a slightly wide-based, diplegic gait. The patient has a negative Romberg, but it is unsteady. Tandem gait was not attempted.  Reflexes: Deep tendon reflexes are symmetric.   Assessment/Plan:  1. Multiple sclerosis  2. Diabetic peripheral neuropathy  3. Neurogenic bladder  4. Gait disturbance  5. Chronic fatigue  The patient will be taken off of the Provigil as it does not appear to be helping him. The patient will be placed on oxybutynin for the bladder with the Flomax to see if this is helpful. If not, a referral to a urology physician may be in order. The patient will follow-up in 6 months, we will plan on repeating a MRI of the brain and cervical spine at that time.  Marlan Palau MD 11/29/2015 3:59 PM  [facility] [address] [place], [place] [address]  Phone [phone] Fax [phone]

## 2016-02-08 DIAGNOSIS — Z0289 Encounter for other administrative examinations: Secondary | ICD-10-CM

## 2016-02-20 ENCOUNTER — Other Ambulatory Visit: Payer: Self-pay | Admitting: Neurology

## 2016-05-18 ENCOUNTER — Telehealth: Payer: Self-pay | Admitting: Neurology

## 2016-05-18 NOTE — Telephone Encounter (Signed)
Got call from [facility] that pt received prescription for his rebif. However, this was sent to his pharmacy which he has to pay big bucks. He has been getting medication from the support center for free. Therefore, support center would like a prescription faxed over to them to process.   Since pt has been out of rebif since 04/23/16, therefore, ideally he needs titration package to re-start the medication. He is also using auto-injector so he does not want pre-filled syringes.   Because the clinic is closed for today, they are OK with [place] to fax over the prescription to them next Monday.   Please order rebif rebidose titration package and also rebif solution for auto injection and fax over to [phone]. If any questions, please call Victorino Dike at [phone]. Thanks.  Marvel Plan, MD PhD Stroke Neurology 05/18/2016 4:23 PM

## 2016-05-21 NOTE — Telephone Encounter (Signed)
Rx and form for Rebif assistance was completed, signed and faxed to [facility] # [phone] on Friday, 05/18/16. Spoke to [employer] rep today and they did receive that fax. Since pt has been off of med for greater than 2 wks, rx was updated for pt to restart Rebif w/ titration pack as recommended by [facility].

## 2016-05-21 NOTE — Telephone Encounter (Signed)
I'm not sure we need to start back with the lower dose of the medication, but we do need to get the patient back on his Rebif.

## 2016-05-31 ENCOUNTER — Telehealth: Payer: Self-pay | Admitting: *Deleted

## 2016-05-31 NOTE — Telephone Encounter (Signed)
Called and LVM for pt asking for return call to discuss rx Rebif. Received fax from [facility] pharmacy stating patient unable to afford copay. They gave him contact information for manufacturer to seek assistance: Manufacturer name: [employer] Manufacturer phone #: [phone]  *Patient needs to call and request assitance. Please let pt know if he calls

## 2016-06-04 ENCOUNTER — Ambulatory Visit: Payer: BLUE CROSS/BLUE SHIELD | Admitting: Neurology

## 2016-06-06 NOTE — Telephone Encounter (Signed)
Patient called office back. He verified he has contact information below. He has not started rebif back yet. He just got the medication. He is going to be titrating back up to . He has 8.2mcg, then and will evetually go back to .  He has no further questions at this time. He has a follow-up with Dr Anne Hahn on 06/15/16 at 12:00pm, check in 11:30-11:45pm. He verbalized understanding. He knows to call back if he has any further questions/concerns.

## 2016-06-06 NOTE — Telephone Encounter (Signed)
Called and spoke to someone other than pt. Asked for pt, she stated he was not available and wanted to take message. Advised he did not have DPR (hippa form) on file and I cannot speak with anyone but him. She verbalized understanding and took phone number. She will have him call.

## 2016-06-15 ENCOUNTER — Encounter: Payer: Self-pay | Admitting: Neurology

## 2016-06-15 ENCOUNTER — Ambulatory Visit (INDEPENDENT_AMBULATORY_CARE_PROVIDER_SITE_OTHER): Payer: Medicare Other | Admitting: Neurology

## 2016-06-15 VITALS — BP 116/74 | HR 81 | Ht 72.0 in | Wt 190.0 lb

## 2016-06-15 DIAGNOSIS — G35 Multiple sclerosis: Secondary | ICD-10-CM | POA: Diagnosis not present

## 2016-06-15 DIAGNOSIS — E1142 Type 2 diabetes mellitus with diabetic polyneuropathy: Secondary | ICD-10-CM

## 2016-06-15 DIAGNOSIS — R269 Unspecified abnormalities of gait and mobility: Secondary | ICD-10-CM

## 2016-06-15 MED ORDER — INSULIN GLARGINE 300 UNIT/ML ~~LOC~~ SOPN: 10.0000 [IU] | PEN_INJECTOR | Freq: Every day | SUBCUTANEOUS | Status: DC

## 2016-06-15 MED ORDER — SITAGLIPTIN PHOSPHATE 100 MG PO TABS: 100.0000 mg | ORAL_TABLET | Freq: Every day | ORAL | Status: DC

## 2016-06-15 NOTE — Progress Notes (Signed)
Reason for visit: Multiple sclerosis  Derek Collins is an 66 y.o. male  History of present illness:  Derek Collins is a 66 year old right-handed white male with a history multiple sclerosis. The patient has diabetes and a diabetic peripheral neuropathy, he has a gait disorder that is in part related to the MS and in part secondary to the diabetes. The patient does have some numbness and discomfort in the feet, he takes Cymbalta and gabapentin. He does have a gait instability issue, he has fallen on one occasion since last seen. He does not use a cane with ambulation. The patient has more problems with the right leg than the left. He has had some blurring of vision, particularly with the right eye, he will be seeing an ophthalmologist. He has had some financial issues in getting on Rebif, he has just recently restarted the medication. He gets annual blood work through his primary care physician. He returns to this office for an evaluation.  Past Medical History:  Diagnosis Date  . Carpal tunnel syndrome, bilateral   . Chronic fatigue   . Chronic leg pain   . Chronic low back pain   . Depression   . Diabetes (HCC)   . Diabetic peripheral neuropathy (HCC)   . Gait disorder   . Macular degeneration of left eye   . Multiple sclerosis (HCC)   . Obesity   . Renal calculi   . Urinary frequency     Past Surgical History:  Procedure Laterality Date  . HERNIA REPAIR    . TONSILLECTOMY      Family History  Problem Relation Age of Onset  . Stroke Father   . Kidney failure Mother   . High blood pressure Mother   . Cancer Sister     Breast cancer/Renal cell carcinoma    Social history:  reports that he has quit smoking. His smoking use included Cigarettes. He has a 25.00 pack-year smoking history. He has never used smokeless tobacco. He reports that he does not drink alcohol or use drugs.   No Known Allergies  Medications:  Prior to Admission medications   Medication Sig Start Date  End Date Taking? Authorizing Provider  gabapentin (NEURONTIN) 400 MG capsule Take 1 capsule (400 mg total) by mouth 3 (three) times daily. 07/23/14  Yes Butch Penny, NP  Interferon Beta-1a (REBIF REBIDOSE) 44 MCG/0.5ML SOAJ Inject 44 mcg into the skin 3 (three) times a week. 06/23/15  Yes York Spaniel, MD  metFORMIN (GLUCOPHAGE-XR) 500 MG 24 hr tablet Take 500 mg by mouth 4 (four) times daily.  07/20/12  Yes Historical Provider, MD  ONE TOUCH ULTRA TEST test strip 1 each by Other route 3 (three) times daily. -Contour 07/01/13  Yes Historical Provider, MD  oxybutynin (DITROPAN) 5 MG tablet TAKE ONE TABLET BY MOUTH THREE TIMES DAILY 02/20/16  Yes York Spaniel, MD  pravastatin (PRAVACHOL) 40 MG tablet Take 40 mg by mouth daily. 08/06/13  Yes Historical Provider, MD  repaglinide (PRANDIN) 1 MG tablet Take 2 mg by mouth 3 (three) times daily before meals.   Yes Historical Provider, MD  tamsulosin (FLOMAX) 0.4 MG CAPS capsule Take by mouth. 11/17/15  Yes Historical Provider, MD  Testosterone (ANDROGEL TD) Place 1.62 % onto the skin daily.   Yes Historical Provider, MD  UNABLE TO FIND Take 1 tablet by mouth daily. Med Name: Occuvite   Yes Historical Provider, MD  DULoxetine (CYMBALTA) 60 MG capsule Take 60 mg by mouth daily.  06/13/15 06/12/16  Historical Provider, MD  Insulin Glargine (TOUJEO SOLOSTAR) 300 UNIT/ML SOPN Inject 10 Units into the skin at bedtime. 06/15/16   York Spaniel, MD  lisinopril (PRINIVIL,ZESTRIL) 5 MG tablet Take 5 mg by mouth daily. 12/13/14 12/13/15  Historical Provider, MD  sitaGLIPtin (JANUVIA) 100 MG tablet Take 1 tablet (100 mg total) by mouth daily. 06/15/16   York Spaniel, MD    ROS:  Out of a complete 14 system review of symptoms, the patient complains only of the following symptoms, and all other reviewed systems are negative.  Fatigue Blurred vision Snoring Easy bruising Numbness, weakness, dizziness Sleepiness  Blood pressure 116/74, pulse 81, height 6'  (1.829 m), weight 190 lb (86.2 kg).  Physical Exam  General: The patient is alert and cooperative at the time of the examination. The patient is moderately obese.  Skin: No significant peripheral edema is noted.   Neurologic Exam  Mental status: The patient is alert and oriented x 3 at the time of the examination. The patient has apparent normal recent and remote memory, with an apparently normal attention span and concentration ability.   Cranial nerves: Facial symmetry is present. Speech is normal, no aphasia or dysarthria is noted. Extraocular movements are full. Visual fields are full. Pupils are equal, round, and reactive to light. Discs are flat bilaterally.  Motor: The patient has good strength in all 4 extremities.  Sensory examination: Soft touch sensation is symmetric on the face, arms, and legs.  Coordination: The patient has good finger-nose-finger and heel-to-shin bilaterally.  Gait and station: The patient has a wide-based, unsteady gait. The patient has a limping quality on the right leg. Tandem gait was not attempted. Romberg is negative. No drift is seen.  Reflexes: Deep tendon reflexes are symmetric, reflexes in the arms and knees are somewhat brisk.   Assessment/Plan:  1. Multiple sclerosis  2. Diabetic peripheral neuropathy  3. Gait disorder  The patient will continue the Rebif for now, we will consider a repeat MRI of the brain and cervical spine around the time of the next visit. The patient will follow-up in 6 months.  Marlan Palau MD 06/15/2016 12:57 PM  [facility] [address] [place], [place] [address]  Phone [phone] Fax [phone]

## 2016-07-22 HISTORY — PX: EYE SURGERY: SHX253

## 2016-08-12 ENCOUNTER — Other Ambulatory Visit: Payer: Self-pay | Admitting: Neurology

## 2016-12-03 ENCOUNTER — Other Ambulatory Visit: Payer: Self-pay | Admitting: Neurology

## 2016-12-13 ENCOUNTER — Ambulatory Visit (INDEPENDENT_AMBULATORY_CARE_PROVIDER_SITE_OTHER): Payer: Medicare Other | Admitting: Adult Health

## 2016-12-13 ENCOUNTER — Encounter: Payer: Self-pay | Admitting: Adult Health

## 2016-12-13 VITALS — BP 144/84 | HR 88 | Wt 194.2 lb

## 2016-12-13 DIAGNOSIS — E1142 Type 2 diabetes mellitus with diabetic polyneuropathy: Secondary | ICD-10-CM

## 2016-12-13 DIAGNOSIS — G35 Multiple sclerosis: Secondary | ICD-10-CM

## 2016-12-13 DIAGNOSIS — R269 Unspecified abnormalities of gait and mobility: Secondary | ICD-10-CM | POA: Diagnosis not present

## 2016-12-13 MED ORDER — DULOXETINE HCL 30 MG PO CPEP: ORAL_CAPSULE | ORAL | 11 refills | Status: DC

## 2016-12-13 NOTE — Progress Notes (Signed)
I have read the note, and I agree with the clinical assessment and plan.  Derek Collins KEITH   

## 2016-12-13 NOTE — Progress Notes (Signed)
PATIENT: Derek Collins DOB: 03/03/1951  REASON FOR VISIT: follow up- multiple sclerosis HISTORY FROM: patient  HISTORY OF PRESENT ILLNESS: Today 12/13/16 Derek Collins is a 66 year old male with a history of multiple sclerosis and diabetic neuropathy. He returns today for follow-up. He states that over time his gait has progressively gotten worse. He also feels that he is gotten gradually weaker. He denies any sudden changes. He does not use a cane or walker when ambulating. Denies any recent falls. He does state that he continues to have burning in the feet. He reports that it is tolerable just aggravating. Reports that he continues on Cymbalta and gabapentin. He denies any changes with the bowels or bladder. He reports that he did have cataract surgery in May however the surgery did not go well and his vision has been altered. He states that he does get frustrated with his inability to do things that he used to do well. 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 He does not feel that he is depressed however he reports that he does not have any desire or energy to do anything. He returns today for an evaluation.  HISTORY 06/15/16: Derek Collins is a 66 year old right-handed white male with a history multiple sclerosis. The patient has diabetes and a diabetic peripheral neuropathy, he has a gait disorder that is in part related to the MS and in part secondary to the diabetes. The patient does have some numbness and discomfort in the feet, he takes Cymbalta and gabapentin. He does have a gait instability issue, he has fallen on one occasion since last seen. He does not use a cane with ambulation. The patient has more problems with the right leg than the left. He has had some blurring of vision, particularly with the right eye, he will be seeing an ophthalmologist. He has had some financial issues in getting on Rebif, he has just recently restarted the medication. He  gets annual blood work through his primary care physician. He returns to this office for an evaluation.  REVIEW OF SYSTEMS: Out of a complete 14 system review of symptoms, the patient complains only of the following symptoms, and all other reviewed systems are negative.  Fatigue, light sensitivity, blurred vision, constipation, daytime sleepiness, snoring, walking difficulty, frequency of urination, bruise/bleed easily, weakness, agitation  ALLERGIES: No Known Allergies  HOME MEDICATIONS: Outpatient Medications Prior to Visit  Medication Sig Dispense Refill  . DULoxetine (CYMBALTA) 60 MG capsule Take 60 mg by mouth daily.     Marland Kitchen gabapentin (NEURONTIN) 400 MG capsule Take 1 capsule (400 mg total) by mouth 3 (three) times daily. 90 capsule 11  . Insulin Glargine (TOUJEO SOLOSTAR) 300 UNIT/ML SOPN Inject 10 Units into the skin at bedtime.    . Interferon Beta-1a (REBIF REBIDOSE) 44 MCG/0.5ML SOAJ Inject 44 mcg into the skin 3 (three) times a week. 0.5 mL 11  . lisinopril (PRINIVIL,ZESTRIL) 5 MG tablet Take 5 mg by mouth daily.    . metFORMIN (GLUCOPHAGE-XR) 500 MG 24 hr tablet Take 500 mg by mouth 4 (four) times daily.     . ONE TOUCH ULTRA TEST test strip 1 each by Other route 3 (three) times daily. -Contour    . oxybutynin (DITROPAN) 5 MG tablet TAKE 1 TABLET BY MOUTH THREE TIMES DAILY 270 tablet 1  . pravastatin (PRAVACHOL) 40 MG tablet Take 40 mg by mouth daily.    . repaglinide (PRANDIN)  1 MG tablet Take 2 mg by mouth 3 (three) times daily before meals.    . sitaGLIPtin (JANUVIA) 100 MG tablet Take 1 tablet (100 mg total) by mouth daily.    . tamsulosin (FLOMAX) 0.4 MG CAPS capsule Take by mouth.    . Testosterone (ANDROGEL TD) Place 1.62 % onto the skin daily.    Marland Kitchen UNABLE TO FIND Take 1 tablet by mouth daily. Med Name: Occuvite     No facility-administered medications prior to visit.     PAST MEDICAL HISTORY: Past Medical History:  Diagnosis Date  . Carpal tunnel syndrome,  bilateral   . Chronic fatigue   . Chronic leg pain   . Chronic low back pain   . Depression   . Diabetes (HCC)   . Diabetic peripheral neuropathy (HCC)   . Gait disorder   . Macular degeneration of left eye   . Multiple sclerosis (HCC)   . Obesity   . Renal calculi   . Urinary frequency     PAST SURGICAL HISTORY: Past Surgical History:  Procedure Laterality Date  . HERNIA REPAIR    . TONSILLECTOMY      FAMILY HISTORY: Family History  Problem Relation Age of Onset  . Stroke Father   . Kidney failure Mother   . High blood pressure Mother   . Cancer Sister        Breast cancer/Renal cell carcinoma    SOCIAL HISTORY: Social History   Social History  . Marital status: Married    Spouse name: N/A  . Number of children: 2  . Years of education: 1-College   Occupational History  . unemployed Other    Scientist, physiological   Social History Main Topics  . Smoking status: Former Smoker    Packs/day: 1.00    Years: 25.00    Types: Cigarettes  . Smokeless tobacco: Never Used     Comment: Quit 30 years ago.  . Alcohol use No     Comment: Consumes beer on occasion  . Drug use: No  . Sexual activity: Not on file   Other Topics Concern  . Not on file   Social History Narrative   Patient lives at home with his wife [name].    Patient is disabled.   Education high school two years vocational.   Left handed.   Caffeine two soda's daily.      PHYSICAL EXAM  Vitals:   12/13/16 1345  BP: (!) 144/84  Pulse: 88  Weight: 194 lb 3.2 oz (88.1 kg)   Body mass index is 26.34 kg/m.  Generalized: Well developed, in no acute distress   Neurological examination  Mentation: Alert oriented to time, place, history taking. Follows all commands speech and language fluent Cranial nerve II-XII: Pupils were equal round reactive to light. Extraocular movements were full, visual field were full on confrontational test. Facial sensation and strength were normal. Uvula tongue midline.  Head turning and shoulder shrug  were normal and symmetric. Motor: The motor testing reveals 5 over 5 strength of all 4 extremities. Good symmetric motor tone is noted throughout.  Sensory: Sensory testing is intact to soft touch on all 4 extremities. No evidence of extinction is noted.  Coordination: Cerebellar testing reveals good finger-nose-finger and heel-to-shin bilaterally.  Gait and station: Gait is wide-based. He does limp on the right leg. Gait is slightly unsteady. Tandem gait was not attempted. Reflexes: Deep tendon reflexes are symmetric and normal bilaterally.   DIAGNOSTIC DATA (LABS, IMAGING, TESTING) -  I reviewed patient records, labs, notes, testing and imaging myself where available.  Lab Results  Component Value Date   WBC 6.4 09/11/2013   HGB 13.9 09/11/2013   HCT 41.3 09/11/2013   MCV 89 09/11/2013   PLT 277 09/11/2013      Component Value Date/Time   NA 138 09/11/2013 1531   K 5.0 09/11/2013 1531   CL 97 09/11/2013 1531   CO2 23 09/11/2013 1531   GLUCOSE 163 (H) 09/11/2013 1531   GLUCOSE 181 (H) 10/12/2008 1112   BUN 17 09/11/2013 1531   CREATININE 1.09 09/11/2013 1531   CALCIUM 10.8 (H) 09/11/2013 1531   PROT 7.4 09/11/2013 1531   ALBUMIN 5.0 (H) 09/11/2013 1531   AST 19 09/11/2013 1531   ALT 19 09/11/2013 1531   ALKPHOS 111 09/11/2013 1531   BILITOT 0.3 09/11/2013 1531   GFRNONAA 72 09/11/2013 1531   GFRAA 84 09/11/2013 1531      ASSESSMENT AND PLAN 66 y.o. year old male  has a past medical history of Carpal tunnel syndrome, bilateral; Chronic fatigue; Chronic leg pain; Chronic low back pain; Depression; Diabetes (HCC); Diabetic peripheral neuropathy (HCC); Gait disorder; Macular degeneration of left eye; Multiple sclerosis (HCC); Obesity; Renal calculi; and Urinary frequency. here with:  1. Multiple sclerosis 2. Abnormality of gait 3. Diabetic neuropathy  Overall the patient has remained stable. His last MRI of the brain and cervical spine was  in 2015. We will repeat these scans to look for any progression. The patient will continue on Rebif. He had blood work that his primary care provider. I've asked that they fax the results to our office. In the future we may consider sending him for physical therapy however at this time he has deferred. The patient continues to have some discomfort in the legs due to neuropathy. We will increase Cymbalta to 90 mg- 60 mg in the morning and 30 mg at evening. Patient is advised that if his symptoms worsen or he develops new symptoms he should let us know. He will follow-up in 6 months or sooner if needed.     Butch Penny, MSN, NP-C 12/13/2016, 1:43 PM [facility] [address] [place], [place] [address] [phone]

## 2016-12-13 NOTE — Patient Instructions (Addendum)
Your Plan:  Continue Rebif Have PCP fax over recent lab work MRI brain and spine Increase Cymbalta to 60 mg in the AM and 30 mg at bedtime  If your symptoms worsen or you develop new symptoms please let us know.       Thank you for coming to see [place] at [facility]. I hope we have been able to provide you high quality care today.  You may receive a patient satisfaction survey over the next few weeks. We would appreciate your feedback and comments so that we may continue to improve ourselves and the health of our patients.

## 2016-12-31 ENCOUNTER — Ambulatory Visit
Admission: RE | Admit: 2016-12-31 | Discharge: 2016-12-31 | Disposition: A | Discharge status: Home / Self Care | Payer: Medicare Other | Source: Ambulatory Visit | Attending: Adult Health | Admitting: Adult Health

## 2016-12-31 DIAGNOSIS — G35 Multiple sclerosis: Secondary | ICD-10-CM

## 2016-12-31 MED ORDER — GADOBENATE DIMEGLUMINE 529 MG/ML IV SOLN
18.0000 mL | Freq: Once | INTRAVENOUS | Status: AC | PRN
  Administered 2016-12-31: 18 mL via INTRAVENOUS

## 2017-01-02 ENCOUNTER — Telehealth: Payer: Self-pay | Admitting: *Deleted

## 2017-01-02 NOTE — Telephone Encounter (Signed)
Spoke with patient and informed him his MRI brain is stable from previous scan in 2009. He verbalized understanding, appreciation.

## 2017-01-02 NOTE — Telephone Encounter (Signed)
Spoke with patient and informed him that his MRI of cervical spine is consistent with the previous MRI in 2009. Advised him, however there was some progression of degenerative changes at C6-C7. Advised Aundra Millet will not make any change in his treatment at this time. He questioned if another scan would be done. This RN advised he will be followed with scans but not soon unless he develops new or worsening symptoms.  Advised he call prior to his follow up for any concerns, questions, worsening symptoms.  Patient verbalized understanding, appreciation , had no further questions.

## 2017-05-22 ENCOUNTER — Telehealth: Payer: Self-pay | Admitting: *Deleted

## 2017-05-22 NOTE — Telephone Encounter (Signed)
Received fax from [facility] stating Duloxetine, #3 caps per day is approved thru 04/22/18 under Medicare Part D benefit.

## 2017-05-22 NOTE — Telephone Encounter (Signed)
Called [facility], spoke with [employer] to initiate pa for Duloxetine, exceeds quantity limits of 2 caps per day. Information provided, determination will be received by fax in 48-72 hours. Case # [id number].

## 2017-06-10 ENCOUNTER — Other Ambulatory Visit: Payer: Self-pay | Admitting: *Deleted

## 2017-06-10 ENCOUNTER — Other Ambulatory Visit: Payer: Self-pay | Admitting: Neurology

## 2017-06-10 MED ORDER — INTERFERON BETA-1A 44 MCG/0.5ML ~~LOC~~ SOAJ: 44.0000 ug | SUBCUTANEOUS | 5 refills | Status: DC

## 2017-06-17 ENCOUNTER — Other Ambulatory Visit: Payer: Self-pay

## 2017-06-17 ENCOUNTER — Ambulatory Visit: Payer: Medicare Other | Admitting: Neurology

## 2017-06-17 ENCOUNTER — Encounter: Payer: Self-pay | Admitting: Neurology

## 2017-06-17 VITALS — BP 132/77 | HR 107 | Ht 72.0 in | Wt 197.0 lb

## 2017-06-17 DIAGNOSIS — R269 Unspecified abnormalities of gait and mobility: Secondary | ICD-10-CM

## 2017-06-17 DIAGNOSIS — G35 Multiple sclerosis: Secondary | ICD-10-CM | POA: Diagnosis not present

## 2017-06-17 DIAGNOSIS — E1142 Type 2 diabetes mellitus with diabetic polyneuropathy: Secondary | ICD-10-CM | POA: Diagnosis not present

## 2017-06-17 NOTE — Progress Notes (Signed)
Reason for visit: Multiple sclerosis, diabetic peripheral neuropathy  Derek Collins is an 67 y.o. male  History of present illness:  Derek Collins is a 67 year old right-handed white male with a history of multiple sclerosis currently on Rebif.  The patient is tolerating this medication well, he reports no new numbness, weakness, bowel bladder control problems, or memory changes.  The patient has had some visual problems associated with retinal abnormalities and cataracts.  He is followed through ophthalmology.  The patient reports that he is not operating a motor vehicle in part secondary to vision.  He has fallen on occasion, usually uses a cane for ambulation.  He has numbness in the feet, but he is able to sleep well at night without significant discomfort from the neuropathy.  The patient recently had an adjustment in his insulin dose to help control his blood sugars.  The patient has had recent MRI evaluations of the brain and spinal cord that did not show progression of his multiple sclerosis.  Past Medical History:  Diagnosis Date  . Carpal tunnel syndrome, bilateral   . Chronic fatigue   . Chronic leg pain   . Chronic low back pain   . Depression   . Diabetes (HCC)   . Diabetic peripheral neuropathy (HCC)   . Gait disorder   . Macular degeneration of left eye   . Multiple sclerosis (HCC)   . Obesity   . Renal calculi   . Urinary frequency     Past Surgical History:  Procedure Laterality Date  . EYE SURGERY Right    cataract  . EYE SURGERY Right 07/2016   repair of previous surgery  . HERNIA REPAIR    . TONSILLECTOMY      Family History  Problem Relation Age of Onset  . Stroke Father   . Kidney failure Mother   . High blood pressure Mother   . Cancer Sister        Breast cancer/Renal cell carcinoma    Social history:  reports that he has quit smoking. His smoking use included cigarettes. He has a 25.00 pack-year smoking history. he has never used smokeless  tobacco. He reports that he does not drink alcohol or use drugs.   No Known Allergies  Medications:  Prior to Admission medications   Medication Sig Start Date End Date Taking? Authorizing Provider  clotrimazole-betamethasone (LOTRISONE) cream Apply 1 application topically 2 (two) times daily. 05/20/17  Yes [provider]  DULoxetine (CYMBALTA) 30 MG capsule Take 2 caps PO in the AM and 1 cap at bedtime 12/13/16  Yes [place], Aundra Millet, NP  gabapentin (NEURONTIN) 400 MG capsule Take 1 capsule (400 mg total) by mouth 3 (three) times daily. 07/23/14  Yes Butch Penny, NP  Insulin Glargine (TOUJEO SOLOSTAR) 300 UNIT/ML SOPN Inject 10 Units into the skin at bedtime. 06/15/16  Yes York Spaniel, MD  Interferon Beta-1a (REBIF REBIDOSE) 44 MCG/0.5ML SOAJ Inject 44 mcg into the skin 3 (three) times a week. Protect from light 06/10/17  Yes Butch Penny, NP  metFORMIN (GLUCOPHAGE-XR) 500 MG 24 hr tablet Take 500 mg by mouth 4 (four) times daily.  07/20/12  Yes [provider]  ONE TOUCH ULTRA TEST test strip 1 each by Other route 3 (three) times daily. -Contour 07/01/13  Yes [provider]  oxybutynin (DITROPAN) 5 MG tablet TAKE 1 TABLET BY MOUTH THREE TIMES DAILY 06/10/17  Yes Butch Penny, NP  pravastatin (PRAVACHOL) 40 MG tablet Take 40 mg by  mouth daily. 08/06/13  Yes [provider]  repaglinide (PRANDIN) 1 MG tablet Take 2 mg by mouth 3 (three) times daily before meals.   Yes [provider]  sitaGLIPtin (JANUVIA) 100 MG tablet Take 1 tablet (100 mg total) by mouth daily. 06/15/16  Yes York Spaniel, MD  tamsulosin [facility]) 0.4 MG CAPS capsule Take by mouth. 11/17/15  Yes [provider]  Testosterone (ANDROGEL TD) Place 1.62 % onto the skin daily.   Yes [provider]  UNABLE TO FIND Take 1 tablet by mouth daily. Med Name: Occuvite   Yes [provider]  lisinopril (PRINIVIL,ZESTRIL) 5 MG tablet Take 5 mg by mouth daily.  12/13/14 12/13/15  [provider]    ROS:  Out of a complete 14 system review of symptoms, the patient complains only of the following symptoms, and all other reviewed systems are negative.  Fatigue Light sensitivity, blurred vision Leg swelling Cold intolerance Constipation Restless legs, snoring Incontinence of the bladder, frequency of urination, urinary urgency Back pain, walking difficulty Skin rash Bruising easily Weakness  Blood pressure 132/77, pulse (!) 107, height 6' (1.829 m), weight 197 lb (89.4 kg).  Physical Exam  General: The patient is alert and cooperative at the time of the examination.  The patient is moderately obese.  Skin: No significant peripheral edema is noted.   Neurologic Exam  Mental status: The patient is alert and oriented x 3 at the time of the examination. The patient has apparent normal recent and remote memory, with an apparently normal attention span and concentration ability.   Cranial nerves: Facial symmetry is present. Speech is normal, no aphasia or dysarthria is noted. Extraocular movements are full. Visual fields are full.  Pupils are equal, round, and reactive to light.  Discs are flat bilaterally.  Motor: The patient has good strength in all 4 extremities.  Sensory examination: Soft touch sensation is symmetric on the face, arms, and legs.  Coordination: The patient has good finger-nose-finger and heel-to-shin bilaterally.  Gait and station: The patient has a slightly wide-based, limping type gait.  The patient usually uses a cane for ambulation.  Romberg is negative. No drift is seen.  Reflexes: Deep tendon reflexes are symmetric.   Assessment/Plan:  1.  Multiple sclerosis  2.  Peripheral neuropathy, diabetic  3.  Gait disorder  The patient is to remain on Requip, this has resulted in good stability of his multiple sclerosis.  The patient is having some gait problems associated with diabetes and with his  peripheral neuropathy.  The patient is having some visual disturbances associated with intrinsic retinal disease and cataracts.  He will follow-up in this office in about 6 months.  He gets blood work done regularly through his primary care physician.  Marlan Palau MD 06/17/2017 2:26 PM  [facility] [address] [place], [place] [address]  Phone [phone] Fax [phone]

## 2017-09-09 ENCOUNTER — Telehealth: Payer: Self-pay | Admitting: Neurology

## 2017-09-09 NOTE — Telephone Encounter (Signed)
[email or web address] - called, they has been trying to reach pt about Interferon Beta-1a (REBIF REBIDOSE) 44 MCG/0.5ML SOAJ.  Shanda Bumps stated if they don't hear from pt within next 2 weeks they will put the medication on hold.  No call back requested

## 2017-09-09 NOTE — Telephone Encounter (Signed)
Called wife# on DPR. She stated she got two missed calls from specialty pharmacy previously but she was busy and they did not need a refill that soon when they called. She also got a call this am. Recommended they contact specialty pharmacy to let them know. She will call back to speak w/ them. Nothing further needed.

## 2017-09-09 NOTE — Telephone Encounter (Signed)
Called, LVM for pt/family to call office back. Please relay message below if they call

## 2017-12-06 ENCOUNTER — Telehealth: Payer: Self-pay | Admitting: Adult Health

## 2017-12-06 NOTE — Telephone Encounter (Signed)
Rebiff nurse Marcelino Duster [phone])  presented to the lobby stating that Mr. Coomer needs to get an apt sooner than 9/9 as he is having frequent falls. Pt. Best call back is [phone]

## 2017-12-10 NOTE — Telephone Encounter (Signed)
LMVM for Derek Collins, rebif relating to message below.  She is to call back if needed.  This was a Financial planner.

## 2017-12-10 NOTE — Telephone Encounter (Signed)
I spoke to pt and he stated that he wanted to leave things as they were for 12-30-17.  I offered 12-12-17 at 1500 and he stated that he has to have people leave work to bring him and he said to leave as it is.  He stated that he had gout in knee and attributes falls from this and feels that he is getting better.  He does use mobility aides.  He states he did not hurt himself.  Carlyn, daughter is checking on him daily.  He will call us back if needed prior to 12-30-17 appt.

## 2017-12-10 NOTE — Telephone Encounter (Signed)
LMVM for pt to return call about appt with MM/NP sooner appt Thursday 12-12-17 at 1500 available for frequent falls.

## 2017-12-30 ENCOUNTER — Encounter: Payer: Self-pay | Admitting: Adult Health

## 2017-12-30 ENCOUNTER — Telehealth: Payer: Self-pay | Admitting: Adult Health

## 2017-12-30 ENCOUNTER — Other Ambulatory Visit: Payer: Self-pay | Admitting: Adult Health

## 2017-12-30 ENCOUNTER — Ambulatory Visit: Payer: Medicare Other | Admitting: Adult Health

## 2017-12-30 VITALS — BP 96/62 | HR 94 | Ht 72.0 in | Wt 180.6 lb

## 2017-12-30 DIAGNOSIS — F329 Major depressive disorder, single episode, unspecified: Secondary | ICD-10-CM

## 2017-12-30 DIAGNOSIS — G35 Multiple sclerosis: Secondary | ICD-10-CM

## 2017-12-30 DIAGNOSIS — F32A Depression, unspecified: Secondary | ICD-10-CM

## 2017-12-30 DIAGNOSIS — R269 Unspecified abnormalities of gait and mobility: Secondary | ICD-10-CM

## 2017-12-30 MED ORDER — DULOXETINE HCL 60 MG PO CPEP: 60.0000 mg | ORAL_CAPSULE | Freq: Two times a day (BID) | ORAL | 5 refills | Status: DC

## 2017-12-30 NOTE — Patient Instructions (Signed)
Your Plan:  Continue rebif Increase cymbalta 60 mg twice a day MRI brain and cervical spine If your symptoms worsen or you develop new symptoms please let us know.   Thank you for coming to see [place] at [facility]. I hope we have been able to provide you high quality care today.  You may receive a patient satisfaction survey over the next few weeks. We would appreciate your feedback and comments so that we may continue to improve ourselves and the health of our patients.

## 2017-12-30 NOTE — Progress Notes (Addendum)
PATIENT: Derek Collins DOB: [birth year]  REASON FOR VISIT: follow up HISTORY FROM: patient  HISTORY OF PRESENT ILLNESS: Today 12/30/17:  Derek Collins is a 67 year old male with a history of multiple sclerosis.  He returns today for follow-up.  He is with his 2 daughters.  He continues on Rebif.  His daughter reports that he had several falls in the last 2 to 3 months.  They feel that his walking has gotten worse.  He does use a cane or walker when ambulating.  They state that he has began to wear depends.  He reports that his urinary frequency and urgency has increased in the last 2 months.  The patient reports that he sleeps okay.  Reports good appetite.  He does feel depressed at times.  He also feels that his right side is weaker than it used to be.  He denies any new numbness or tingling.  He returns today for evaluation.  HISTORY Derek Collins is a 67 year old right-handed white male with a history of multiple sclerosis currently on Rebif.  The patient is tolerating this medication well, he reports no new numbness, weakness, bowel bladder control problems, or memory changes.  The patient has had some visual problems associated with retinal abnormalities and cataracts.  He is followed through ophthalmology.  The patient reports that he is not operating a motor vehicle in part secondary to vision.  He has fallen on occasion, usually uses a cane for ambulation.  He has numbness in the feet, but he is able to sleep well at night without significant discomfort from the neuropathy.  The patient recently had an adjustment in his insulin dose to help control his blood sugars.  The patient has had recent MRI evaluations of the brain and spinal cord that did not show progression of his multiple sclerosis.  REVIEW OF SYSTEMS: Out of a complete 14 system review of symptoms, the patient complains only of the following symptoms, and all other reviewed systems are negative.  See HPI  ALLERGIES: No Known  Allergies  HOME MEDICATIONS: Outpatient Medications Prior to Visit  Medication Sig Dispense Refill  . DULoxetine (CYMBALTA) 30 MG capsule Take 2 caps PO in the AM and 1 cap at bedtime 90 capsule 11  . gabapentin (NEURONTIN) 400 MG capsule Take 1 capsule (400 mg total) by mouth 3 (three) times daily. 90 capsule 11  . Insulin Glargine (TOUJEO SOLOSTAR) 300 UNIT/ML SOPN Inject 10 Units into the skin at bedtime.    . Interferon Beta-1a (REBIF REBIDOSE) 44 MCG/0.5ML SOAJ Inject 44 mcg into the skin 3 (three) times a week. Protect from light 12 Syringe 5  . metFORMIN (GLUCOPHAGE-XR) 500 MG 24 hr tablet Take 500 mg by mouth 4 (four) times daily.     . ONE TOUCH ULTRA TEST test strip 1 each by Other route 3 (three) times daily. -Contour    . oxybutynin (DITROPAN) 5 MG tablet TAKE 1 TABLET BY MOUTH THREE TIMES DAILY 270 tablet 1  . pravastatin (PRAVACHOL) 40 MG tablet Take 40 mg by mouth daily.    . repaglinide (PRANDIN) 1 MG tablet Take 2 mg by mouth 3 (three) times daily before meals.    . sitaGLIPtin (JANUVIA) 100 MG tablet Take 1 tablet (100 mg total) by mouth daily.    . tamsulosin (FLOMAX) 0.4 MG CAPS capsule Take by mouth.    Marland Kitchen UNABLE TO FIND Take 1 tablet by mouth daily. Med Name: Occuvite    . clotrimazole-betamethasone (LOTRISONE) cream  Apply 1 application topically 2 (two) times daily.    Marland Kitchen lisinopril (PRINIVIL,ZESTRIL) 5 MG tablet Take 5 mg by mouth daily.    . Testosterone (ANDROGEL TD) Place 1.62 % onto the skin daily.     No facility-administered medications prior to visit.     PAST MEDICAL HISTORY: Past Medical History:  Diagnosis Date  . Carpal tunnel syndrome, bilateral   . Chronic fatigue   . Chronic leg pain   . Chronic low back pain   . Depression   . Diabetes (HCC)   . Diabetic peripheral neuropathy (HCC)   . Gait disorder   . Macular degeneration of left eye   . Multiple sclerosis (HCC)   . Obesity   . Renal calculi   . Urinary frequency     PAST SURGICAL  HISTORY: Past Surgical History:  Procedure Laterality Date  . EYE SURGERY Right    cataract  . EYE SURGERY Right 07/2016   repair of previous surgery  . HERNIA REPAIR    . TONSILLECTOMY      FAMILY HISTORY: Family History  Problem Relation Age of Onset  . Stroke Father   . Kidney failure Mother   . High blood pressure Mother   . Cancer Sister        Breast cancer/Renal cell carcinoma    SOCIAL HISTORY: Social History   Socioeconomic History  . Marital status: Married    Spouse name: Not on file  . Number of children: 2  . Years of education: 1-College  . Highest education level: Not on file  Occupational History  . Occupation: unemployed    Associate Professor: OTHER    Comment: Geneticist, molecular  . Financial resource strain: Not on file  . Food insecurity:    Worry: Not on file    Inability: Not on file  . Transportation needs:    Medical: Not on file    Non-medical: Not on file  Tobacco Use  . Smoking status: Former Smoker    Packs/day: 1.00    Years: 25.00    Pack years: 25.00    Types: Cigarettes  . Smokeless tobacco: Never Used  . Tobacco comment: Quit 30 years ago.  Substance and Sexual Activity  . Alcohol use: No    Alcohol/week: 0.0 standard drinks    Comment: Consumes beer on occasion  . Drug use: No  . Sexual activity: Not on file  Lifestyle  . Physical activity:    Days per week: Not on file    Minutes per session: Not on file  . Stress: Not on file  Relationships  . Social connections:    Talks on phone: Not on file    Gets together: Not on file    Attends religious service: Not on file    Active member of club or organization: Not on file    Attends meetings of clubs or organizations: Not on file    Relationship status: Not on file  . Intimate partner violence:    Fear of current or ex partner: Not on file    Emotionally abused: Not on file    Physically abused: Not on file    Forced sexual activity: Not on file  Other Topics Concern   . Not on file  Social History Narrative   Patient lives at home with his wife Derek Collins.    Patient is disabled.   Education high school two years vocational.   Left handed.   Caffeine two soda's daily.  PHYSICAL EXAM  Vitals:   12/30/17 1346  BP: 96/62  Pulse: 94  Weight: 180 lb 9.6 oz (81.9 kg)  Height: 6' (1.829 m)   Body mass index is 24.49 kg/m.  Generalized: Well developed, in no acute distress   Neurological examination  Mentation: Alert oriented to time, place, history taking. Follows all commands speech and language fluent Cranial nerve II-XII: Pupils were equal round reactive to light. Extraocular movements were full, visual field were full on confrontational test. Facial sensation and strength were normal. Uvula tongue midline. Head turning and shoulder shrug  were normal and symmetric. Motor: The motor testing reveals 5 over 5 strength of all 4 extremities.  Right foot drop noted. good symmetric motor tone is noted throughout.  Sensory: Sensory testing is intact to soft touch on all 4 extremities. No evidence of extinction is noted.  Coordination: Cerebellar testing reveals good finger-nose-finger and heel-to-shin bilaterally.  Gait and station: Gait is wide-based and unsteady.  Gilmer Mor is used when ambulating.  Tandem gait not attempted. Reflexes: Deep tendon reflexes are symmetric and normal bilaterally.   DIAGNOSTIC DATA (LABS, IMAGING, TESTING) - I reviewed patient records, labs, notes, testing and imaging myself where available.  Lab Results  Component Value Date   WBC 6.4 09/11/2013   HGB 13.9 09/11/2013   HCT 41.3 09/11/2013   MCV 89 09/11/2013   PLT 277 09/11/2013      Component Value Date/Time   NA 138 09/11/2013 1531   K 5.0 09/11/2013 1531   CL 97 09/11/2013 1531   CO2 23 09/11/2013 1531   GLUCOSE 163 (H) 09/11/2013 1531   GLUCOSE 181 (H) 10/12/2008 1112   BUN 17 09/11/2013 1531   CREATININE 1.09 09/11/2013 1531   CALCIUM 10.8 (H)  09/11/2013 1531   PROT 7.4 09/11/2013 1531   ALBUMIN 5.0 (H) 09/11/2013 1531   AST 19 09/11/2013 1531   ALT 19 09/11/2013 1531   ALKPHOS 111 09/11/2013 1531   BILITOT 0.3 09/11/2013 1531   GFRNONAA 72 09/11/2013 1531   GFRAA 84 09/11/2013 1531   No results found for: CHOL, HDL, LDLCALC, LDLDIRECT, TRIG, CHOLHDL No results found for: ZOXW9U No results found for: VITAMINB12 No results found for: TSH    ASSESSMENT AND PLAN 67 y.o. year old male  has a past medical history of Carpal tunnel syndrome, bilateral, Chronic fatigue, Chronic leg pain, Chronic low back pain, Depression, Diabetes (HCC), Diabetic peripheral neuropathy (HCC), Gait disorder, Macular degeneration of left eye, Multiple sclerosis (HCC), Obesity, Renal calculi, and Urinary frequency. here with:  1.  Multiple sclerosis 2.  Abnormality of gait 3.  Depression  The patient will continue on Rebif.  We will check MRI of the brain and cervical spine to look for progression of MS.  I will also refer the patient for physical therapy.  The patient also complained of depression.  He is on Cymbalta.  I will increase to 120 mg daily.  If this is not beneficial he should let us know.  He will follow-up in 6 months or sooner if needed.   Butch Penny, MSN, NP-C 12/30/2017, 1:52 PM [facility] [address] [place], [place] [address] [phone]

## 2017-12-30 NOTE — Progress Notes (Signed)
I have read the note, and I agree with the clinical assessment and plan.  Shakiya Mcneary K Maher Shon   

## 2017-12-30 NOTE — Telephone Encounter (Signed)
[employer] Medicare order sent to [facility]. No auth they will reach out to the pt to schedule.  °

## 2017-12-31 ENCOUNTER — Other Ambulatory Visit: Payer: Self-pay

## 2017-12-31 ENCOUNTER — Emergency Department (HOSPITAL_COMMUNITY)
Admission: EM | Admit: 2017-12-31 | Discharge: 2017-12-31 | Disposition: A | Discharge status: Home / Self Care | Payer: Medicare Other | Attending: Emergency Medicine | Admitting: Emergency Medicine

## 2017-12-31 ENCOUNTER — Encounter (HOSPITAL_COMMUNITY): Payer: Self-pay

## 2017-12-31 DIAGNOSIS — Z79899 Other long term (current) drug therapy: Secondary | ICD-10-CM | POA: Diagnosis not present

## 2017-12-31 DIAGNOSIS — Z87891 Personal history of nicotine dependence: Secondary | ICD-10-CM | POA: Diagnosis not present

## 2017-12-31 DIAGNOSIS — R7989 Other specified abnormal findings of blood chemistry: Secondary | ICD-10-CM | POA: Diagnosis not present

## 2017-12-31 DIAGNOSIS — E1165 Type 2 diabetes mellitus with hyperglycemia: Secondary | ICD-10-CM | POA: Diagnosis not present

## 2017-12-31 DIAGNOSIS — Z794 Long term (current) use of insulin: Secondary | ICD-10-CM | POA: Diagnosis not present

## 2017-12-31 LAB — CBC WITH DIFFERENTIAL/PLATELET
Basophils Absolute: 0 10*3/uL (ref 0.0–0.2)
Basos: 1 %
EOS (ABSOLUTE): 0.1 10*3/uL (ref 0.0–0.4)
Eos: 1 %
Hematocrit: 40 % (ref 37.5–51.0)
Hemoglobin: 13.4 g/dL (ref 13.0–17.7)
IMMATURE GRANS (ABS): 0 10*3/uL (ref 0.0–0.1)
Immature Granulocytes: 0 %
Lymphocytes Absolute: 1.1 10*3/uL (ref 0.7–3.1)
Lymphs: 17 %
MCH: 29.4 pg (ref 26.6–33.0)
MCHC: 33.5 g/dL (ref 31.5–35.7)
MCV: 88 fL (ref 79–97)
MONOS ABS: 0.7 10*3/uL (ref 0.1–0.9)
Monocytes: 10 %
NEUTROS ABS: 4.7 10*3/uL (ref 1.4–7.0)
Neutrophils: 71 %
PLATELETS: 309 10*3/uL (ref 150–450)
RBC: 4.56 x10E6/uL (ref 4.14–5.80)
RDW: 13.1 % (ref 12.3–15.4)
WBC: 6.5 10*3/uL (ref 3.4–10.8)

## 2017-12-31 LAB — COMPREHENSIVE METABOLIC PANEL
ALBUMIN: 4.4 g/dL (ref 3.6–4.8)
ALT: 23 IU/L (ref 0–44)
AST: 19 IU/L (ref 0–40)
Albumin/Globulin Ratio: 1.5 (ref 1.2–2.2)
Alkaline Phosphatase: 121 IU/L — ABNORMAL HIGH (ref 39–117)
BUN/Creatinine Ratio: 15 (ref 10–24)
BUN: 22 mg/dL (ref 8–27)
Bilirubin Total: 0.7 mg/dL (ref 0.0–1.2)
CALCIUM: 10.9 mg/dL — AB (ref 8.6–10.2)
CO2: 20 mmol/L (ref 20–29)
Chloride: 91 mmol/L — ABNORMAL LOW (ref 96–106)
Creatinine, Ser: 1.51 mg/dL — ABNORMAL HIGH (ref 0.76–1.27)
GFR calc non Af Amer: 47 mL/min/{1.73_m2} — ABNORMAL LOW (ref >59)
GFR, EST AFRICAN AMERICAN: 55 mL/min/{1.73_m2} — AB (ref >59)
GLUCOSE: 700 mg/dL — AB (ref 65–99)
Globulin, Total: 2.9 g/dL (ref 1.5–4.5)
POTASSIUM: 5.7 mmol/L — AB (ref 3.5–5.2)
Sodium: 126 mmol/L — ABNORMAL LOW (ref 134–144)
Total Protein: 7.3 g/dL (ref 6.0–8.5)

## 2017-12-31 LAB — URINALYSIS, ROUTINE W REFLEX MICROSCOPIC
Bilirubin Urine: NEGATIVE
Glucose, UA: >=500 mg/dL — AB
Hgb urine dipstick: NEGATIVE
Ketones, ur: NEGATIVE mg/dL
Leukocytes, UA: NEGATIVE
Nitrite: NEGATIVE
Protein, ur: NEGATIVE mg/dL
Specific Gravity, Urine: 1.017 (ref 1.005–1.030)
pH: 5 (ref 5.0–8.0)

## 2017-12-31 LAB — CBC
HEMATOCRIT: 38.7 % — AB (ref 39.0–52.0)
Hemoglobin: 13.3 g/dL (ref 13.0–17.0)
MCH: 28.8 pg (ref 26.0–34.0)
MCHC: 34.4 g/dL (ref 30.0–36.0)
MCV: 83.8 fL (ref 78.0–100.0)
Platelets: 309 10*3/uL (ref 150–400)
RBC: 4.62 MIL/uL (ref 4.22–5.81)
RDW: 13.2 % (ref 11.5–15.5)
WBC: 7.5 10*3/uL (ref 4.0–10.5)

## 2017-12-31 LAB — BASIC METABOLIC PANEL
Anion gap: 11 (ref 5–15)
BUN: 23 mg/dL (ref 8–23)
CO2: 24 mmol/L (ref 22–32)
CREATININE: 1.69 mg/dL — AB (ref 0.61–1.24)
Calcium: 11 mg/dL — ABNORMAL HIGH (ref 8.9–10.3)
Chloride: 97 mmol/L — ABNORMAL LOW (ref 98–111)
GFR calc Af Amer: 47 mL/min — ABNORMAL LOW (ref >60)
GFR, EST NON AFRICAN AMERICAN: 41 mL/min — AB (ref >60)
GLUCOSE: 412 mg/dL — AB (ref 70–99)
POTASSIUM: 4.7 mmol/L (ref 3.5–5.1)
Sodium: 132 mmol/L — ABNORMAL LOW (ref 135–145)

## 2017-12-31 LAB — CBG MONITORING, ED
GLUCOSE-CAPILLARY: 325 mg/dL — AB (ref 70–99)
Glucose-Capillary: 409 mg/dL — ABNORMAL HIGH (ref 70–99)
Glucose-Capillary: 405 mg/dL — ABNORMAL HIGH (ref 70–99)

## 2017-12-31 MED ORDER — SODIUM CHLORIDE 0.9 % IV BOLUS
1000.0000 mL | Freq: Once | INTRAVENOUS | Status: AC
Start: 2017-12-31 — End: 2017-12-31
  Administered 2017-12-31: 1000 mL via INTRAVENOUS

## 2017-12-31 MED ORDER — INSULIN ASPART 100 UNIT/ML ~~LOC~~ SOLN
2.0000 [IU] | Freq: Once | SUBCUTANEOUS | Status: AC
  Administered 2017-12-31: 2 [IU] via INTRAVENOUS
  Filled 2017-12-31: qty 1

## 2017-12-31 NOTE — Telephone Encounter (Signed)
-----   Message from Butch Penny, NP sent at 12/31/2017  8:37 AM EDT ----- Glucose is critically high. Spoke to Dr. Anne Hahn. Asked RN to call him urgently. Ask what his sugars are when he is checking them-if they are high he needs to go to the ED

## 2017-12-31 NOTE — ED Triage Notes (Addendum)
Pt presents with report of CBG at home over 500 yesterday.  Pt was seen at PCP, had labs drawn with glucose over 700.  Pt checked it again this morning with reading over 300.  Pt denies any pain, nausea or vomiting, reports increased weakness with h/o MS and DM.  Pt reports he has been out of insulin x 2-3 days, is taking PO.

## 2017-12-31 NOTE — ED Provider Notes (Signed)
[facility] EMERGENCY DEPARTMENT Provider Note   CSN: [id number] Arrival date & time: 12/31/17  1102     History   Chief Complaint No chief complaint on file.   HPI Derek Collins is a 67 y.o. male with history of diabetes presenting from home for elevated blood sugar. Patient was seen at his neurologist yesterday for routine checkup.  Blood work was performed at this visit and patient was noted to have a blood glucose of over 700. Patient was contacted today by his neurology office regarding his elevated blood sugar and informed to come to the emergency department for treatment. Patient denies recent illness, denies any pain at this time, states that he is feeling well.  Patient with wife and daughter at bedside both agree that patient has not been experiencing any adverse symptoms recently and is here solely for increased blood glucose levels yesterday at his doctor's office. Patient states that his blood sugar this morning was 300 on his home monitor.  Patient states that he has been out of his insulin for the last 3 days, states that the pharmacy has been delayed in filling his prescription.  Patient denies recent illness, fever, abdominal pain, confusion, increased urination, nausea/vomiting, weakness or headache.  HPI  Past Medical History:  Diagnosis Date  . Carpal tunnel syndrome, bilateral   . Chronic fatigue   . Chronic leg pain   . Chronic low back pain   . Depression   . Diabetes (HCC)   . Diabetic peripheral neuropathy (HCC)   . Gait disorder   . Macular degeneration of left eye   . Multiple sclerosis (HCC)   . Obesity   . Renal calculi   . Urinary frequency     Patient Active Problem List   Diagnosis Date Noted  . Diabetic polyneuropathy (HCC) 03/06/2012  . Other general symptoms(780.99) 03/06/2012  . Polyneuropathy in other diseases classified elsewhere (HCC) 03/06/2012  . Encounter for therapeutic drug monitoring 03/06/2012  . Carpal  tunnel syndrome 03/06/2012  . Pain in limb 03/06/2012  . Lumbago 03/06/2012  . Abnormality of gait 03/06/2012  . Sprain of lumbosacral (joint) (ligament) 03/06/2012  . Multiple sclerosis (HCC) 03/06/2012    Past Surgical History:  Procedure Laterality Date  . EYE SURGERY Right    cataract  . EYE SURGERY Right 07/2016   repair of previous surgery  . HERNIA REPAIR    . TONSILLECTOMY          Home Medications    Prior to Admission medications   Medication Sig Start Date End Date Taking? Authorizing Provider  clotrimazole-betamethasone (LOTRISONE) cream Apply 1 application topically 2 (two) times daily. 05/20/17   [provider]  DULoxetine (CYMBALTA) 60 MG capsule Take 1 capsule (60 mg total) by mouth 2 (two) times daily. 12/30/17   Butch Penny, NP  gabapentin (NEURONTIN) 400 MG capsule Take 1 capsule (400 mg total) by mouth 3 (three) times daily. 07/23/14   Butch Penny, NP  Insulin Glargine (TOUJEO SOLOSTAR) 300 UNIT/ML SOPN Inject 10 Units into the skin at bedtime. 06/15/16   York Spaniel, MD  lisinopril (PRINIVIL,ZESTRIL) 5 MG tablet Take 5 mg by mouth daily. 12/13/14 12/13/15  [provider]  metFORMIN (GLUCOPHAGE-XR) 500 MG 24 hr tablet Take 500 mg by mouth 4 (four) times daily.  07/20/12   [provider]  ONE TOUCH ULTRA TEST test strip 1 each by Other route 3 (three) times daily. -Contour 07/01/13   [provider]  oxybutynin (DITROPAN) 5 MG tablet TAKE 1 TABLET BY MOUTH THREE TIMES DAILY 06/10/17   Butch Penny, NP  pravastatin (PRAVACHOL) 40 MG tablet Take 40 mg by mouth daily. 08/06/13   [provider]  REBIF REBIDOSE 44 MCG/0.5ML SOAJ Use as directed by physician. Give 44 mcg (0.5 ml) as a subcutaneous injection three times per week.   PROTECT FROM LIGHT 12/31/17   Butch Penny, NP  repaglinide (PRANDIN) 1 MG tablet Take 2 mg by mouth 3 (three) times daily before meals.    [provider]  sitaGLIPtin  (JANUVIA) 100 MG tablet Take 1 tablet (100 mg total) by mouth daily. 06/15/16   York Spaniel, MD  tamsulosin [facility]) 0.4 MG CAPS capsule Take by mouth. 11/17/15   [provider]  Testosterone (ANDROGEL TD) Place 1.62 % onto the skin daily.    [provider]  UNABLE TO FIND Take 1 tablet by mouth daily. Med Name: Occuvite    [provider]    Family History Family History  Problem Relation Age of Onset  . Stroke Father   . Kidney failure Mother   . High blood pressure Mother   . Cancer Sister        Breast cancer/Renal cell carcinoma    Social History Social History   Tobacco Use  . Smoking status: Former Smoker    Packs/day: 1.00    Years: 25.00    Pack years: 25.00    Types: Cigarettes  . Smokeless tobacco: Never Used  . Tobacco comment: Quit 30 years ago.  Substance Use Topics  . Alcohol use: No    Alcohol/week: 0.0 standard drinks    Comment: Consumes beer on occasion  . Drug use: No     Allergies   Patient has no known allergies.   Review of Systems Review of Systems  Constitutional: Negative.  Negative for chills, fatigue and fever.  Eyes: Negative.  Negative for visual disturbance.  Respiratory: Negative.  Negative for shortness of breath.   Cardiovascular: Negative.  Negative for chest pain.  Gastrointestinal: Negative.  Negative for abdominal pain, diarrhea, nausea and vomiting.  Endocrine: Negative.  Negative for polyuria.  Neurological: Negative.  Negative for dizziness, syncope, weakness, light-headedness, numbness and headaches.  All other systems reviewed and are negative.    Physical Exam Updated Vital Signs BP 111/74   Pulse 76   Temp 97.9 F (36.6 C) (Oral)   Resp 20   SpO2 97%   Physical Exam  Constitutional: He is oriented to person, place, and time. He appears well-developed and well-nourished. No distress.  HENT:  Head: Normocephalic and atraumatic.  Right Ear: External ear normal.  Left Ear:  External ear normal.  Nose: Nose normal.  Eyes: Pupils are equal, round, and reactive to light. EOM are normal.  Neck: Trachea normal and normal range of motion. No tracheal deviation present.  Cardiovascular: Normal rate, regular rhythm, normal heart sounds and intact distal pulses.  Pulmonary/Chest: Effort normal and breath sounds normal. No respiratory distress. He has no wheezes.  Abdominal: Soft. Bowel sounds are normal. There is no tenderness. There is no rebound and no guarding.  Musculoskeletal: Normal range of motion. He exhibits no edema or tenderness.  Neurological: He is alert and oriented to person, place, and time. He has normal strength. No cranial nerve deficit or sensory deficit. GCS eye subscore is 4. GCS verbal subscore is 5. GCS motor subscore is 6.  Mental Status: Alert, oriented, thought content appropriate, able to  give a coherent history. Speech fluent without evidence of aphasia. Able to follow 2 step commands without difficulty. Cranial Nerves: II-XII: Grossly intact bilaterally. Motor: Normal tone. 5/5 strength in upper and lower extremities bilaterally including strong and equal grip strength and dorsiflexion/plantar flexion Sensory: Sensation intact to light touch in all extremities. CV: distal pulses palpable throughout  Skin: Skin is warm and dry.  Psychiatric: He has a normal mood and affect. His behavior is normal.    ED Treatments / Results  Labs (all labs ordered are listed, but only abnormal results are displayed) Labs Reviewed  BASIC METABOLIC PANEL - Abnormal; Notable for the following components:      Result Value   Sodium 132 (*)    Chloride 97 (*)    Glucose, Bld 412 (*)    Creatinine, Ser 1.69 (*)    Calcium 11.0 (*)    GFR calc non Af Amer 41 (*)    GFR calc Af Amer 47 (*)    All other components within normal limits  CBC - Abnormal; Notable for the following components:   HCT 38.7 (*)    All other components within normal limits    URINALYSIS, ROUTINE W REFLEX MICROSCOPIC - Abnormal; Notable for the following components:   Glucose, UA >=500 (*)    Bacteria, UA RARE (*)    All other components within normal limits  CBG MONITORING, ED - Abnormal; Notable for the following components:   Glucose-Capillary 409 (*)    All other components within normal limits  CBG MONITORING, ED - Abnormal; Notable for the following components:   Glucose-Capillary 405 (*)    All other components within normal limits  CBG MONITORING, ED - Abnormal; Notable for the following components:   Glucose-Capillary 325 (*)    All other components within normal limits    EKG None  Radiology No results found.  Procedures Procedures (including critical care time)  Medications Ordered in ED Medications  sodium chloride 0.9 % bolus 1,000 mL (0 mLs Intravenous Stopped 12/31/17 1445)  insulin aspart (novoLOG) injection 2 Units (2 Units Intravenous Given 12/31/17 1536)     Initial Impression / Assessment and Plan / ED Course  I have reviewed the triage vital signs and the nursing notes.  Pertinent labs & imaging results that were available during my care of the patient were reviewed by me and considered in my medical decision making (see chart for details).    Patient presenting for asymptomatic hyperglycemia, encouraged to present by his neurologist's office after elevated blood sugar yesterday. On evaluation today patient with no complaints, blood glucose 406, no anion gap, no ketones present in the urine, bicarb within normal limits, BMP shows elevated creatinine patient given fluid bolus here in department.  CBC nonacute, urinalysis nonacute.  Patient given fluid bolus in the emergency department and 2 units of NovoLog.  CBG decreased to 325.  Patient and family member have been speaking with their primary care office and at the pharmacy during this visit and have coordinated medication refill.  Patient and family state that they will leave  emergency department to fill these prescriptions immediately.  Patient encouraged to take home insulin as directed from his primary care doctor and to follow-up with their office as soon as possible for further evaluation.  At this time there does not appear to be any evidence of an acute emergency medical condition and the patient appears stable for discharge with appropriate outpatient follow up. Diagnosis was discussed with patient who  verbalizes understanding of care plan and is agreeable to discharge. I have discussed return precautions with patient and family at bedside who verbalize understanding of return precautions. Patient strongly encouraged to follow-up with their PCP. All questions answered.  Patient's case discussed with Dr. Deretha Emory who agrees with plan to discharge with follow-up.     Note: Portions of this report may have been transcribed using voice recognition software. Every effort was made to ensure accuracy; however, inadvertent computerized transcription errors may still be present.  Final Clinical Impressions(s) / ED Diagnoses   Final diagnoses:  Type 2 diabetes mellitus with hyperglycemia, with long-term current use of insulin (HCC)  Elevated serum creatinine    ED Discharge Orders    None       Elizabeth Palau 12/31/17 Trenton Founds, MD 01/16/18 [phone]

## 2017-12-31 NOTE — ED Notes (Signed)
Pt aware of needing a urine sample.

## 2017-12-31 NOTE — Telephone Encounter (Signed)
Spoke with pt's wife Samara Deist and explained that labs came back this am showing pt's blood sugar is 700. I asked if pt. checks his blood sugar at home and she sts. he does, although not as regularly as he should. Sts. he check his blood sugar yesterday prior to his appt. in our office and it was 523. I have explained that this is a dangerous blood sugar level, esp. if it was not treated, and that pt. should go directly to the ER for tx.  She sts. she will leave work now to go home and take pt. to the ER/fim

## 2017-12-31 NOTE — Discharge Instructions (Addendum)
Please return to the Emergency Department for any new or worsening symptoms or if your symptoms do not improve. Please be sure to follow up with your Primary Care Physician as soon as possible regarding your visit today. If you do not have a Primary Doctor please use the resources below to establish one. Please take your insulin as prescribed by your primary care doctor and monitor your blood sugars daily.  Please call your primary care doctor to schedule a follow-up appointment regarding her visit today as soon as possible. Your kidney function test was elevated today please drink plenty of water and follow-up with your primary care provider regarding this as well.  Contact a health care provider if: Your blood glucose level is too high or too low. You have ketones in your urine. You have a fever. You cannot eat. You cannot tolerate fluids. You have been vomiting for more than 2 hours. You continue to have symptoms of this condition. You develop new symptoms. Get help right away if: Your blood glucose levels continue to be high (elevated). Your monitor reads high even when you are taking insulin. You faint. You have chest pain. You have trouble breathing. You have a sudden, severe headache. You have sudden weakness in one arm or one leg. You have sudden trouble speaking or swallowing. You have vomiting or diarrhea that gets worse after 3 hours. You feel severely fatigued. You have trouble thinking. You have abdominal pain. You are severely dehydrated. Symptoms of severe dehydration include: Extreme thirst. Dry mouth. Blue lips. Cold hands and feet. Rapid breathing.  RESOURCE GUIDE  Chronic Pain Problems: Contact [facility]  [phone] Patients need to be referred by their primary care doctor.  Insufficient Money for Medicine: Contact United Way:  call "211" or [facility] [phone] 852-4356.  No Primary Care Doctor: Call [facility] Connect  [phone] - can  help you locate a primary care doctor that  accepts your insurance, provides certain services, etc. Physician Referral Service- [phone]  Agencies that provide inexpensive medical care: [facility]  [phone] [facility]  [phone] [facility]  [phone] [facility]  [phone] [facility]  [phone] [facility]  [phone]  Medicaid-accepting [facility] Providers: [facility]- [address]  [phone], Mon-Fri 9am-7pm, Sat 9am-1pm [facility]- [address] [place], Suite [place]  [phone] [facility]- [address], [address] [place]  [phone] [facility]- [address]  [phone] Renaye Rakers- [address] [place], [address], [phone]  Only accepts [place] patients after they have their name  applied to their card  Self Pay (no insurance) in [facility]: Sickle Cell Patients: Dr Willey Blade, [facility]  [address] [place], [phone] [facility]- [address] [place]  [phone]       [facility]- [address]       -     [facility]- see information above (Speak to [employer] if you do not have insurance)       -  [facility]- [address] [place], [phone]       -  [facility]- [address],  [phone]       -  [facility]- [address], [phone]       -  Dr Julio Sicks-  [address], [place], [phone]       -  [facility]- [address] [phone]       -  [facility]- [address], [phone], also [address] [phone]       -    [facility]- [address] [place], [phone], 1st & 3rd Saturday   every month, 10am-1pm  1) Find a Doctor and Pay Out of Pocket Although you won't have to find out who is covered by your insurance  plan, it is a good idea to ask around and get recommendations. You will then need to call the office and see if the doctor you have chosen will accept you as a new patient and what types of options they offer for patients who are self-pay. Some doctors offer discounts or will set up payment plans for their patients who do not have insurance, but you will need to ask so you aren't surprised when you get to your appointment.  2) Contact Your Local Health Department Not all health departments have doctors that can see patients for sick visits, but many do, so it is worth a call to see if yours does. If you don't know where your local health department is, you can check in your phone book. The CDC also has a tool to help you locate your state's health department, and many state websites also have listings of all of their local health departments.  3) Find a Walk-in Clinic If your illness is not likely to be very severe or complicated, you may want to try a walk in clinic. These are popping up all over the country in pharmacies, drugstores, and shopping centers. They're usually staffed by nurse practitioners or physician assistants that have been trained to treat common illnesses and complaints. They're usually fairly quick and inexpensive. However, if you have serious medical issues or chronic medical problems, these are probably not your best option  STD Testing [facility] Department of [facility] [place], STD Clinic, [address], [place], phone [phone] or [phone].  Monday - Friday, call for an appointment. [facility] Department of [employer], STD Clinic, [address], [place], phone [phone] or [phone].  Monday - Friday, call for an appointment.  Abuse/Neglect: [facility] Child Abuse Hotline (860) 332-2806 [facility] Child Abuse Hotline (804) 522-8881 (After Hours)  Emergency Shelter:  [facility] (949)187-3759  Maternity Homes: [facility] at [facility] [place] [facility] 534-543-6451 [facility] 210-452-5871  MRSA Hotline #:   304 051 2751  [facility] Resources  [facility] [place]  United Way [facility] Dept. [address]                 [address]         [address] [place] [address]  [place]                                               Cristobal Goldmann Phone:  [phone]  Phone:  [phone]                   Phone:  [phone]  [facility], [phone] [facility]- [phone]       -     [facility] in [place], [address],                                  [phone], [facility] Child Abuse Hotline (214)022-1004 or [phone] (After Hours)   Behavioral Health Services  Substance Abuse Resources: [facility] Drug [facility]  (979) 107-2804 [facility] 519-197-1680 [facility] (312)717-9109 Floydene Flock 930-537-4335 Residential & Outpatient Substance Abuse Program  (571)710-1096  Psychological Services: [facility]  ([phone] [facility] Services  [phone] [facility], [phone] [place]. [address], [place], ACCESS LINE: [phone] or (416)[phone], [employer]za  Dental Assistance  If unable to pay or uninsured, contact:  [facility] or [facility]. to become qualified for the adult dental clinic.  Patients with Medicaid: [facility] [phone]) [phone] [address], [phone] [address], [phone]  If unable to pay, or uninsured, contact [facility] [phone]) or [facility] Department [phone] in [place], [phone] in [facility]) to become qualified for the adult dental clinic   Other Low-Cost Community Dental  Services: [facility]- [address] [place], [place], [place], [address], [phone], Ext. [address], 2nd and 4th Thursday of the month at 6:30am.  10 clients each day by appointment, can sometimes see walk-in patients if someone does not show for an appointment. [facility]- [address] [place], [place], [address], [phone] [facility] [address], [place], [place], [address], [phone] [facility] Health Department- [phone] [facility] Health Department- [phone] [facility] [phone]

## 2018-01-01 ENCOUNTER — Telehealth: Payer: Self-pay

## 2018-01-01 NOTE — Telephone Encounter (Signed)
I contacted the patient and advised via voicemail(ok per DPR) we have received a copy of his [employer] reimbursement request form. Patient was advised at his earliest convenience to come by the office and pick the paper work up to complete.  The form received is requesting banking information from the patient and copayment's that have been made for this medication. Form placed up at the front desk and extra copy made. MB RN.

## 2018-01-07 NOTE — Telephone Encounter (Signed)
-----   Message from Megan Millikan, NP sent at 12/31/2017  8:37 AM EDT ----- Glucose is critically high. Spoke to Dr. [name]. Asked RN to call him urgently. Ask what his sugars are when he is checking them-if they are high he needs to go to the ED 

## 2018-01-08 NOTE — Telephone Encounter (Signed)
Received fax from [employer], patient's funding is ending on 02/07/18. He must apply for another year of assistance which per [facility] office note, he was notified to come pick up necessary paperwork.

## 2018-03-13 ENCOUNTER — Inpatient Hospital Stay (HOSPITAL_COMMUNITY): Payer: Medicare Other

## 2018-03-13 ENCOUNTER — Inpatient Hospital Stay (HOSPITAL_COMMUNITY)
Admission: EM | Admit: 2018-03-13 | Discharge: 2018-03-15 | DRG: 684 | Disposition: A | Discharge status: Home / Self Care | Payer: Medicare Other | Attending: Internal Medicine | Admitting: Internal Medicine

## 2018-03-13 ENCOUNTER — Other Ambulatory Visit: Payer: Self-pay

## 2018-03-13 ENCOUNTER — Emergency Department (HOSPITAL_COMMUNITY): Payer: Medicare Other

## 2018-03-13 ENCOUNTER — Encounter (HOSPITAL_COMMUNITY): Payer: Self-pay

## 2018-03-13 DIAGNOSIS — E1122 Type 2 diabetes mellitus with diabetic chronic kidney disease: Secondary | ICD-10-CM | POA: Diagnosis present

## 2018-03-13 DIAGNOSIS — I129 Hypertensive chronic kidney disease with stage 1 through stage 4 chronic kidney disease, or unspecified chronic kidney disease: Secondary | ICD-10-CM | POA: Diagnosis present

## 2018-03-13 DIAGNOSIS — Z9841 Cataract extraction status, right eye: Secondary | ICD-10-CM

## 2018-03-13 DIAGNOSIS — G35 Multiple sclerosis: Secondary | ICD-10-CM | POA: Diagnosis present

## 2018-03-13 DIAGNOSIS — Z79899 Other long term (current) drug therapy: Secondary | ICD-10-CM | POA: Diagnosis not present

## 2018-03-13 DIAGNOSIS — Z794 Long term (current) use of insulin: Secondary | ICD-10-CM

## 2018-03-13 DIAGNOSIS — R32 Unspecified urinary incontinence: Secondary | ICD-10-CM | POA: Diagnosis present

## 2018-03-13 DIAGNOSIS — G8929 Other chronic pain: Secondary | ICD-10-CM | POA: Diagnosis present

## 2018-03-13 DIAGNOSIS — Z8051 Family history of malignant neoplasm of kidney: Secondary | ICD-10-CM

## 2018-03-13 DIAGNOSIS — Z803 Family history of malignant neoplasm of breast: Secondary | ICD-10-CM | POA: Diagnosis not present

## 2018-03-13 DIAGNOSIS — M79606 Pain in leg, unspecified: Secondary | ICD-10-CM | POA: Diagnosis present

## 2018-03-13 DIAGNOSIS — F329 Major depressive disorder, single episode, unspecified: Secondary | ICD-10-CM | POA: Diagnosis present

## 2018-03-13 DIAGNOSIS — Z841 Family history of disorders of kidney and ureter: Secondary | ICD-10-CM

## 2018-03-13 DIAGNOSIS — N179 Acute kidney failure, unspecified: Secondary | ICD-10-CM | POA: Diagnosis present

## 2018-03-13 DIAGNOSIS — N182 Chronic kidney disease, stage 2 (mild): Secondary | ICD-10-CM | POA: Diagnosis present

## 2018-03-13 DIAGNOSIS — Z823 Family history of stroke: Secondary | ICD-10-CM | POA: Diagnosis not present

## 2018-03-13 DIAGNOSIS — Z87891 Personal history of nicotine dependence: Secondary | ICD-10-CM

## 2018-03-13 DIAGNOSIS — D631 Anemia in chronic kidney disease: Secondary | ICD-10-CM | POA: Diagnosis present

## 2018-03-13 DIAGNOSIS — L8990 Pressure ulcer of unspecified site, unspecified stage: Secondary | ICD-10-CM

## 2018-03-13 DIAGNOSIS — Z87442 Personal history of urinary calculi: Secondary | ICD-10-CM | POA: Diagnosis not present

## 2018-03-13 DIAGNOSIS — I959 Hypotension, unspecified: Secondary | ICD-10-CM | POA: Diagnosis present

## 2018-03-13 DIAGNOSIS — M545 Low back pain: Secondary | ICD-10-CM | POA: Diagnosis present

## 2018-03-13 DIAGNOSIS — R5383 Other fatigue: Secondary | ICD-10-CM

## 2018-03-13 DIAGNOSIS — E1142 Type 2 diabetes mellitus with diabetic polyneuropathy: Secondary | ICD-10-CM | POA: Diagnosis present

## 2018-03-13 DIAGNOSIS — E119 Type 2 diabetes mellitus without complications: Secondary | ICD-10-CM | POA: Diagnosis not present

## 2018-03-13 DIAGNOSIS — R5381 Other malaise: Secondary | ICD-10-CM

## 2018-03-13 DIAGNOSIS — N178 Other acute kidney failure: Secondary | ICD-10-CM | POA: Diagnosis not present

## 2018-03-13 DIAGNOSIS — E875 Hyperkalemia: Secondary | ICD-10-CM | POA: Diagnosis present

## 2018-03-13 DIAGNOSIS — R296 Repeated falls: Secondary | ICD-10-CM | POA: Diagnosis present

## 2018-03-13 LAB — URINALYSIS, ROUTINE W REFLEX MICROSCOPIC
BILIRUBIN URINE: NEGATIVE
Glucose, UA: NEGATIVE mg/dL
Hgb urine dipstick: NEGATIVE
KETONES UR: NEGATIVE mg/dL
LEUKOCYTES UA: NEGATIVE
NITRITE: NEGATIVE
Protein, ur: NEGATIVE mg/dL
SPECIFIC GRAVITY, URINE: 1.008 (ref 1.005–1.030)
pH: 5 (ref 5.0–8.0)

## 2018-03-13 LAB — COMPREHENSIVE METABOLIC PANEL
ALT: 20 U/L (ref 0–44)
ANION GAP: 11 (ref 5–15)
AST: 26 U/L (ref 15–41)
Albumin: 3.7 g/dL (ref 3.5–5.0)
Alkaline Phosphatase: 79 U/L (ref 38–126)
BUN: 53 mg/dL — ABNORMAL HIGH (ref 8–23)
CO2: 21 mmol/L — AB (ref 22–32)
CREATININE: 5.01 mg/dL — AB (ref 0.61–1.24)
Calcium: 9.8 mg/dL (ref 8.9–10.3)
Chloride: 102 mmol/L (ref 98–111)
GFR, EST AFRICAN AMERICAN: 13 mL/min — AB (ref >60)
GFR, EST NON AFRICAN AMERICAN: 11 mL/min — AB (ref >60)
Glucose, Bld: 153 mg/dL — ABNORMAL HIGH (ref 70–99)
Potassium: 5.1 mmol/L (ref 3.5–5.1)
SODIUM: 134 mmol/L — AB (ref 135–145)
Total Bilirubin: 0.8 mg/dL (ref 0.3–1.2)
Total Protein: 6.5 g/dL (ref 6.5–8.1)

## 2018-03-13 LAB — CBC
HCT: 34.5 % — ABNORMAL LOW (ref 39.0–52.0)
Hemoglobin: 11.1 g/dL — ABNORMAL LOW (ref 13.0–17.0)
MCH: 29.1 pg (ref 26.0–34.0)
MCHC: 32.2 g/dL (ref 30.0–36.0)
MCV: 90.3 fL (ref 80.0–100.0)
PLATELETS: 234 10*3/uL (ref 150–400)
RBC: 3.82 MIL/uL — ABNORMAL LOW (ref 4.22–5.81)
RDW: 13.6 % (ref 11.5–15.5)
WBC: 6 10*3/uL (ref 4.0–10.5)
nRBC: 0 % (ref 0.0–0.2)

## 2018-03-13 LAB — I-STAT TROPONIN, ED: TROPONIN I, POC: 0 ng/mL (ref 0.00–0.08)

## 2018-03-13 LAB — CBG MONITORING, ED
GLUCOSE-CAPILLARY: 73 mg/dL (ref 70–99)
Glucose-Capillary: 145 mg/dL — ABNORMAL HIGH (ref 70–99)

## 2018-03-13 LAB — I-STAT CHEM 8, ED
BUN: 57 mg/dL — ABNORMAL HIGH (ref 8–23)
CREATININE: 5 mg/dL — AB (ref 0.61–1.24)
Calcium, Ion: 1.26 mmol/L (ref 1.15–1.40)
Chloride: 101 mmol/L (ref 98–111)
Glucose, Bld: 148 mg/dL — ABNORMAL HIGH (ref 70–99)
HEMATOCRIT: 31 % — AB (ref 39.0–52.0)
Hemoglobin: 10.5 g/dL — ABNORMAL LOW (ref 13.0–17.0)
POTASSIUM: 5.1 mmol/L (ref 3.5–5.1)
SODIUM: 133 mmol/L — AB (ref 135–145)
TCO2: 23 mmol/L (ref 22–32)

## 2018-03-13 LAB — SODIUM, URINE, RANDOM: Sodium, Ur: 40 mmol/L

## 2018-03-13 LAB — GLUCOSE, CAPILLARY: Glucose-Capillary: 107 mg/dL — ABNORMAL HIGH (ref 70–99)

## 2018-03-13 LAB — I-STAT CG4 LACTIC ACID, ED: LACTIC ACID, VENOUS: 1.87 mmol/L (ref 0.5–1.9)

## 2018-03-13 LAB — HEMOGLOBIN A1C
HEMOGLOBIN A1C: 9.6 % — AB (ref 4.8–5.6)
MEAN PLASMA GLUCOSE: 228.82 mg/dL

## 2018-03-13 LAB — BRAIN NATRIURETIC PEPTIDE: B Natriuretic Peptide: 10.1 pg/mL (ref 0.0–100.0)

## 2018-03-13 LAB — CREATININE, URINE, RANDOM: Creatinine, Urine: 92.23 mg/dL

## 2018-03-13 MED ORDER — ACETAMINOPHEN 325 MG PO TABS: 650.0000 mg | ORAL_TABLET | Freq: Four times a day (QID) | ORAL | Status: DC | PRN

## 2018-03-13 MED ORDER — INSULIN ASPART 100 UNIT/ML ~~LOC~~ SOLN: 0.0000 [IU] | Freq: Every day | SUBCUTANEOUS | Status: DC

## 2018-03-13 MED ORDER — DOCUSATE SODIUM 100 MG PO CAPS
100.0000 mg | ORAL_CAPSULE | Freq: Two times a day (BID) | ORAL | Status: DC
  Administered 2018-03-13 – 2018-03-15 (×5): 100 mg via ORAL
  Filled 2018-03-13 (×5): qty 1

## 2018-03-13 MED ORDER — PRAVASTATIN SODIUM 40 MG PO TABS
40.0000 mg | ORAL_TABLET | Freq: Every day | ORAL | Status: DC
  Administered 2018-03-14 – 2018-03-15 (×2): 40 mg via ORAL
  Filled 2018-03-13 (×2): qty 1

## 2018-03-13 MED ORDER — DULOXETINE HCL 60 MG PO CPEP
60.0000 mg | ORAL_CAPSULE | Freq: Every day | ORAL | Status: DC
  Administered 2018-03-14 – 2018-03-15 (×2): 60 mg via ORAL
  Filled 2018-03-13 (×2): qty 1

## 2018-03-13 MED ORDER — SODIUM CHLORIDE 0.9 % IV BOLUS
500.0000 mL | Freq: Once | INTRAVENOUS | Status: AC
  Administered 2018-03-13: 500 mL via INTRAVENOUS

## 2018-03-13 MED ORDER — DULOXETINE HCL 30 MG PO CPEP
30.0000 mg | ORAL_CAPSULE | Freq: Every day | ORAL | Status: DC
  Administered 2018-03-14 – 2018-03-15 (×2): 30 mg via ORAL
  Filled 2018-03-13 (×2): qty 1

## 2018-03-13 MED ORDER — ONDANSETRON HCL 4 MG/2ML IJ SOLN: 4.0000 mg | Freq: Four times a day (QID) | INTRAMUSCULAR | Status: DC | PRN

## 2018-03-13 MED ORDER — ONDANSETRON HCL 4 MG PO TABS: 4.0000 mg | ORAL_TABLET | Freq: Four times a day (QID) | ORAL | Status: DC | PRN

## 2018-03-13 MED ORDER — TAMSULOSIN HCL 0.4 MG PO CAPS
0.4000 mg | ORAL_CAPSULE | Freq: Every day | ORAL | Status: DC
  Administered 2018-03-14 – 2018-03-15 (×2): 0.4 mg via ORAL
  Filled 2018-03-13 (×2): qty 1

## 2018-03-13 MED ORDER — SODIUM CHLORIDE 0.9 % IV BOLUS
1000.0000 mL | Freq: Once | INTRAVENOUS | Status: AC
  Administered 2018-03-13: 1000 mL via INTRAVENOUS

## 2018-03-13 MED ORDER — INSULIN ASPART 100 UNIT/ML ~~LOC~~ SOLN
0.0000 [IU] | Freq: Three times a day (TID) | SUBCUTANEOUS | Status: DC
  Administered 2018-03-14: 1 [IU] via SUBCUTANEOUS
  Administered 2018-03-14: 5 [IU] via SUBCUTANEOUS
  Administered 2018-03-15: 2 [IU] via SUBCUTANEOUS

## 2018-03-13 MED ORDER — GABAPENTIN 400 MG PO CAPS
400.0000 mg | ORAL_CAPSULE | Freq: Three times a day (TID) | ORAL | Status: DC
  Administered 2018-03-13 – 2018-03-15 (×5): 400 mg via ORAL
  Filled 2018-03-13 (×5): qty 1

## 2018-03-13 MED ORDER — LACTATED RINGERS IV SOLN: INTRAVENOUS | Status: DC

## 2018-03-13 MED ORDER — ACETAMINOPHEN 650 MG RE SUPP: 650.0000 mg | Freq: Four times a day (QID) | RECTAL | Status: DC | PRN

## 2018-03-13 MED ORDER — SODIUM CHLORIDE 0.9 % IV SOLN
INTRAVENOUS | Status: DC
  Administered 2018-03-13 – 2018-03-14 (×3): via INTRAVENOUS

## 2018-03-13 MED ORDER — ENOXAPARIN SODIUM 30 MG/0.3ML ~~LOC~~ SOLN
30.0000 mg | SUBCUTANEOUS | Status: DC
  Administered 2018-03-13 – 2018-03-14 (×2): 30 mg via SUBCUTANEOUS
  Filled 2018-03-13 (×2): qty 0.3

## 2018-03-13 MED ORDER — INSULIN GLARGINE 100 UNIT/ML ~~LOC~~ SOLN
50.0000 [IU] | Freq: Every day | SUBCUTANEOUS | Status: DC
  Administered 2018-03-13 – 2018-03-14 (×2): 50 [IU] via SUBCUTANEOUS
  Filled 2018-03-13 (×3): qty 0.5

## 2018-03-13 NOTE — ED Notes (Signed)
Patient's blood pressure 82/39. Dr. Ophelia Charter made aware. Pt denies chest pain, shortness of breath, lightheadedness, dizziness, and nausea. New orders for bolus from Dr. Ophelia Charter. Will continue to monitor.

## 2018-03-13 NOTE — Consult Note (Signed)
[facility] Renal Consultation Note  Requesting MD: Dr Lorin Mercy Indication for Consultation: AKI   HPI: Derek Collins is a 67 y.o. male with PMH DM dx ~10y ago he thinks (complicated by neuropathy), MS, h/o nephrolithiasis (remote) who presented to the ED today at the request of his PCP for abnormal labs collected yesterday at a visit.  In the ED labs confirmed Cr 5, Na 133, K 5.1, Bicarb 21, WBC 6, Hb 11.1, Plt 234.  Bladder scan showed 135m UOP.  BP 90-100s/50-60s. HR in 80-90s. NS 1L bolus.  He is being admitted to the hospital for evaluation.    He follows with neurology for MS treated with refib since diagnosis ~10y ago. Reported increased urinary frequency at 12/2017 visit.  Cymbalta was increased to 120 from 60 for depression.    Generally wears depends.  In the past day he noted less UOP but prior to that no changes to volume or character.  Has good oral intake, drinks a lot and no GI issues.  No fevers, chills, pain, dysuria.  Did have some 'dizziness' yesterday on background of increasing gait instability and falls in the past few months.  No recent rashes, hemoptysis, dyspnea. Has chronic R foot edema at times, unchanged.  No chest pain, palpitations.  No recent NSAIDs or antibiotics.  He was in the ED 12/31/17 with hyperglycemia to 700 after being out of insulin.  At that time Cr 1.51-1.69mg/dL.  UA at that time showed glucose but was otherwise unremarkable.  Wt 10/2017 clinic visit 85kg, today 86.1kg.   Home meds include metformin 500 QID, lisinopril 5 daily, gabapentin 400 TID, ditropan 5 TID, pravastatin 40, flomax, insulin, januvia 100.   Mother had ESRD and did dialysis for 11 months prior to passing a few years ago.    ROS:  Pertinent items are noted in HPI.  Creatinine, Ser  Date/Time Value Ref Range Status  03/13/2018 11:26 AM 5.00 (H) 0.61 - 1.24 mg/dL Final  03/13/2018 11:11 AM 5.01 (H) 0.61 - 1.24 mg/dL Final  12/31/2017 11:26 AM 1.69 (H) 0.61 - 1.24 mg/dL  Final  12/30/2017 02:14 PM 1.51 (H) 0.76 - 1.27 mg/dL Final  09/11/2013 03:31 PM 1.09 0.76 - 1.27 mg/dL Final   PMHx:   Past Medical History:  Diagnosis Date  . Carpal tunnel syndrome, bilateral   . Chronic fatigue   . Chronic leg pain   . Chronic low back pain   . Depression   . Diabetes ([place]   . Diabetic peripheral neuropathy ([place]   . Gait disorder   . Macular degeneration of left eye   . Multiple sclerosis ([place]   . Obesity   . Renal calculi   . Urinary frequency     Past Surgical History:  Procedure Laterality Date  . EYE SURGERY Right    cataract  . EYE SURGERY Right 07/2016   repair of previous surgery  . HERNIA REPAIR    . TONSILLECTOMY     Family Hx:  Family History  Problem Relation Age of Onset  . Stroke Father   . Kidney failure Mother   . High blood pressure Mother   . Cancer Sister        Breast cancer/Renal cell carcinoma    Social History:  reports that he has quit smoking. His smoking use included cigarettes. He has a 25.00 pack-year smoking history. He has never used smokeless tobacco. He reports that he does not drink alcohol or use drugs.  Allergies: No  Known Allergies  Medications: Prior to Admission medications   Medication Sig Start Date End Date Taking? Authorizing Provider  clotrimazole-betamethasone (LOTRISONE) cream Apply 1 application topically 2 (two) times daily. 05/20/17   [provider]  DULoxetine (CYMBALTA) 60 MG capsule Take 1 capsule (60 mg total) by mouth 2 (two) times daily. 12/30/17   Ward Givens, NP  gabapentin (NEURONTIN) 400 MG capsule Take 1 capsule (400 mg total) by mouth 3 (three) times daily. 07/23/14   Ward Givens, NP  Insulin Glargine (TOUJEO SOLOSTAR) 300 UNIT/ML SOPN Inject 10 Units into the skin at bedtime. 06/15/16   Kathrynn Ducking, MD  lisinopril (PRINIVIL,ZESTRIL) 5 MG tablet Take 5 mg by mouth daily. 12/13/14 12/13/15  [provider]  metFORMIN (GLUCOPHAGE-XR) 500 MG 24 hr tablet Take  500 mg by mouth 4 (four) times daily.  07/20/12   [provider]  ONE TOUCH ULTRA TEST test strip 1 each by Other route 3 (three) times daily. -Contour 07/01/13   [provider]  oxybutynin (DITROPAN) 5 MG tablet TAKE 1 TABLET BY MOUTH THREE TIMES DAILY 06/10/17   Ward Givens, NP  pravastatin (PRAVACHOL) 40 MG tablet Take 40 mg by mouth daily. 08/06/13   [provider]  REBIF REBIDOSE 44 MCG/0.5ML SOAJ Use as directed by physician. Give 44 mcg (0.5 ml) as a subcutaneous injection three times per week.   PROTECT FROM LIGHT 12/31/17   Ward Givens, NP  repaglinide (PRANDIN) 1 MG tablet Take 2 mg by mouth 3 (three) times daily before meals.    [provider]  sitaGLIPtin (JANUVIA) 100 MG tablet Take 1 tablet (100 mg total) by mouth daily. 06/15/16   Kathrynn Ducking, MD  tamsulosin [facility]) 0.4 MG CAPS capsule Take by mouth. 11/17/15   [provider]  Testosterone (ANDROGEL TD) Place 1.62 % onto the skin daily.    [provider]  UNABLE TO FIND Take 1 tablet by mouth daily. Med Name: [facility]    [provider]    I have reviewed the patient's current medications.   Physical Exam: Vitals:   03/13/18 1130 03/13/18 1200  BP: 103/68 (!) 95/48  Pulse: 84 96  Resp: 15 19  Temp:    SpO2: 97% 98%     General: lying calmly in bed HEENT: MMM, dentition ok Eyes:anicteric sclera, glasses Neck: no JVD Heart: RRR, II/VI murmur Lungs: clear to bases Abdomen: soft, obese, NABS GU: bladder not palpable, no foley Extremities: trace R ankle edema Skin: no rashes Neuro: oriented, conversant, no asterixis or tremors  Labs:  Results for orders placed or performed during the hospital encounter of 03/13/18 (from the past 48 hour(s))  CBG monitoring, ED     Status: Abnormal   Collection Time: 03/13/18 10:52 AM  Result Value Ref Range   Glucose-Capillary 145 (H) 70 - 99 mg/dL  CBC     Status: Abnormal   Collection Time: 03/13/18  11:11 AM  Result Value Ref Range   WBC 6.0 4.0 - 10.5 K/uL   RBC 3.82 (L) 4.22 - 5.81 MIL/uL   Hemoglobin 11.1 (L) 13.0 - 17.0 g/dL   HCT 34.5 (L) 39.0 - 52.0 %   MCV 90.3 80.0 - 100.0 fL   MCH 29.1 26.0 - 34.0 pg   MCHC 32.2 30.0 - 36.0 g/dL   RDW 13.6 11.5 - 15.5 %   Platelets 234 150 - 400 K/uL   nRBC 0.0 0.0 - 0.2 %    Comment: Performed at [facility]  [facility], [place] [address], [place], [place] [address]  Comprehensive metabolic panel     Status: Abnormal   Collection Time: 03/13/18 11:11 AM  Result Value Ref Range   Sodium 134 (L) 135 - 145 mmol/L   Potassium 5.1 3.5 - 5.1 mmol/L   Chloride 102 98 - 111 mmol/L   CO2 21 (L) 22 - 32 mmol/L   Glucose, Bld 153 (H) 70 - 99 mg/dL   BUN 53 (H) 8 - 23 mg/dL   Creatinine, Ser 5.01 (H) 0.61 - 1.24 mg/dL   Calcium 9.8 8.9 - 10.3 mg/dL   Total Protein 6.5 6.5 - 8.1 g/dL   Albumin 3.7 3.5 - 5.0 g/dL   AST 26 15 - 41 U/L   ALT 20 0 - 44 U/L   Alkaline Phosphatase 79 38 - 126 U/L   Total Bilirubin 0.8 0.3 - 1.2 mg/dL   GFR calc non Af Amer 11 (L) >60 mL/min   GFR calc Af Amer 13 (L) >60 mL/min    Comment: (NOTE) The eGFR has been calculated using the CKD EPI equation. This calculation has not been validated in all clinical situations. eGFR's persistently <60 mL/min signify possible Chronic Kidney Disease.    Anion gap 11 5 - 15    Comment: Performed at [facility] [address] [place], [place] [address]  Brain natriuretic peptide     Status: None   Collection Time: 03/13/18 11:15 AM  Result Value Ref Range   B Natriuretic Peptide 10.1 0.0 - 100.0 pg/mL    Comment: Performed at [facility] [address], [place], [place] [address]  I-Stat Chem 8, ED     Status: Abnormal   Collection Time: 03/13/18 11:26 AM  Result Value Ref Range   Sodium 133 (L) 135 - 145 mmol/L   Potassium 5.1 3.5 - 5.1 mmol/L   Chloride 101 98 - 111 mmol/L   BUN 57 (H) 8 - 23 mg/dL   Creatinine, Ser 5.00 (H) 0.61 - 1.24 mg/dL   Glucose,  Bld 148 (H) 70 - 99 mg/dL   Calcium, Ion 1.26 1.15 - 1.40 mmol/L   TCO2 23 22 - 32 mmol/L   Hemoglobin 10.5 (L) 13.0 - 17.0 g/dL   HCT 31.0 (L) 39.0 - 52.0 %  I-stat troponin, ED     Status: None   Collection Time: 03/13/18 11:27 AM  Result Value Ref Range   Troponin i, poc 0.00 0.00 - 0.08 ng/mL   Comment 3            Comment: Due to the release kinetics of cTnI, a negative result within the first hours of the onset of symptoms does not rule out myocardial infarction with certainty. If myocardial infarction is still suspected, repeat the test at appropriate intervals.   I-Stat CG4 Lactic Acid, ED     Status: None   Collection Time: 03/13/18 11:29 AM  Result Value Ref Range   Lactic Acid, Venous 1.87 0.5 - 1.9 mmol/L   Assessment/Plan: **AKI:  2 months ago creatinine 1.5 now 5.  Has modest hypotension today (reported dizziness at home too) but otherwise does not have clear insult that would lead to severe AKI. Rebif has reports of TTP/HUS but no evidence for that.  Fairly euvolemic on exam but I think a volume challenge is appropriate - have changed to NS given his mild hyperkalemia.  Hold ACE-I.    Having RN in ED do another bladder scan  now as he still has had no UOP.  Renal US, urinalysis and urine indices pending.  Those may shed some light on etiology of AKI or may lead to further diagnostic testing if hematuria, proteinuria noted. No indications for dialysis at this time.   **HTN: hypotensive, hold ACE-I.  With MS concerned he could have autonomic instability.  Would like orthostatic VS but he's currently too unsteady to complete.  Can try tomorrow.  Check AM cortisol.   **Mild hyperkalemia: follow. No specific therapy past hydration at this time.  **H/o nephrolithiasis:  No symptoms currently.  Imaging pending per above.   **DM:  Care per primary, A1c pending.   Justin Mend 03/13/2018, 2:28 PM

## 2018-03-13 NOTE — ED Notes (Signed)
Family at bedside. 

## 2018-03-13 NOTE — ED Notes (Signed)
Pt sitting on side of bed attempting to provide urine sample at this time, pt provided with ice water and diet ginger ale to promote urination.

## 2018-03-13 NOTE — H&P (Signed)
History and Physical    Derek Collins ZOX:[id number] DOB: 01-31-1951 DOA: 03/13/2018  PCP: Joette Catching, MD Consultants:  Anne Hahn - neurology Patient coming from:  Home - lives with wife; NOK: Wife, [phone]  Chief Complaint: Fatigue  HPI: Derek Collins is a 67 y.o. male with medical history significant of MS; DM; and depression presenting with fatigue.  "They told me I had to" come to the ER.  He went to PCP yesterday and did blood work and they called him to come in.  He was "in acute renal failure and he needed to come here."  They came to the ER 9/10 because his glucose was very high, 700+.  He was given insulin, 400 -> 300.  He hasn't really been his usual self since.  The last couple of days, he has not been voiding well.  He usually has urinary incontinence and wears Depends.  No dysuria.  He did void this AM x 1, maybe 3-4 times yesterday - which is much less than usual.  No flank pain.  No known kidney problems, other than remote h/o nephrolithiasis.  No change in PO intake.  No diarrhea.  Increased stumbling/falling since August.  Right leg does give out more than left.  Numbness/tingling of feet.  Normal BMs, no fecal incontinence.   ED Course:  Acute renal failure - ?etiology.  Generalized malaise and fatigue x days.  Decreased UOP the last couple of days.  Creatinine is up to 5 from 1.5 baseline.  Hemodynamically stable.  Review of Systems: As per HPI; otherwise review of systems reviewed and negative.   Ambulatory Status:  Ambulates with a cane - progressed to a walker in the last few months  Past Medical History:  Diagnosis Date  . Carpal tunnel syndrome, bilateral   . Chronic fatigue   . Chronic leg pain   . Chronic low back pain   . Depression   . Diabetes (HCC)   . Diabetic peripheral neuropathy (HCC)   . Gait disorder   . Macular degeneration of left eye   . Multiple sclerosis (HCC)   . Obesity   . Renal calculi   . Urinary frequency     Past Surgical  History:  Procedure Laterality Date  . EYE SURGERY Right    cataract  . EYE SURGERY Right 07/2016   repair of previous surgery  . HERNIA REPAIR    . TONSILLECTOMY      Social History   Socioeconomic History  . Marital status: Married    Spouse name: Not on file  . Number of children: 2  . Years of education: 1-College  . Highest education level: Not on file  Occupational History  . Occupation: retired    Associate Professor: OTHER    Comment: Geneticist, molecular  . Financial resource strain: Not on file  . Food insecurity:    Worry: Not on file    Inability: Not on file  . Transportation needs:    Medical: Not on file    Non-medical: Not on file  Tobacco Use  . Smoking status: Former Smoker    Packs/day: 1.00    Years: 25.00    Pack years: 25.00    Types: Cigarettes  . Smokeless tobacco: Never Used  . Tobacco comment: Quit 30 years ago.  Substance and Sexual Activity  . Alcohol use: No    Alcohol/week: 0.0 standard drinks    Comment: Consumes beer on occasion  . Drug use: No  .  Sexual activity: Not on file  Lifestyle  . Physical activity:    Days per week: Not on file    Minutes per session: Not on file  . Stress: Not on file  Relationships  . Social connections:    Talks on phone: Not on file    Gets together: Not on file    Attends religious service: Not on file    Active member of club or organization: Not on file    Attends meetings of clubs or organizations: Not on file    Relationship status: Not on file  . Intimate partner violence:    Fear of current or ex partner: Not on file    Emotionally abused: Not on file    Physically abused: Not on file    Forced sexual activity: Not on file  Other Topics Concern  . Not on file  Social History Narrative   Patient lives at home with his wife [name].    Patient is disabled.   Education high school two years vocational.   Left handed.   Caffeine two soda's daily.    No Known Allergies  Family History    Problem Relation Age of Onset  . Stroke Father   . Kidney failure Mother   . High blood pressure Mother   . Cancer Sister        Breast cancer/Renal cell carcinoma    Prior to Admission medications   Medication Sig Start Date End Date Taking? Authorizing Provider  clotrimazole-betamethasone (LOTRISONE) cream Apply 1 application topically 2 (two) times daily. 05/20/17   [provider]  DULoxetine (CYMBALTA) 60 MG capsule Take 1 capsule (60 mg total) by mouth 2 (two) times daily. 12/30/17   Butch Penny, NP  gabapentin (NEURONTIN) 400 MG capsule Take 1 capsule (400 mg total) by mouth 3 (three) times daily. 07/23/14   Butch Penny, NP  Insulin Glargine (TOUJEO SOLOSTAR) 300 UNIT/ML SOPN Inject 10 Units into the skin at bedtime. 06/15/16   York Spaniel, MD  lisinopril (PRINIVIL,ZESTRIL) 5 MG tablet Take 5 mg by mouth daily. 12/13/14 12/13/15  [provider]  metFORMIN (GLUCOPHAGE-XR) 500 MG 24 hr tablet Take 500 mg by mouth 4 (four) times daily.  07/20/12   [provider]  ONE TOUCH ULTRA TEST test strip 1 each by Other route 3 (three) times daily. -Contour 07/01/13   [provider]  oxybutynin (DITROPAN) 5 MG tablet TAKE 1 TABLET BY MOUTH THREE TIMES DAILY 06/10/17   Butch Penny, NP  pravastatin (PRAVACHOL) 40 MG tablet Take 40 mg by mouth daily. 08/06/13   [provider]  REBIF REBIDOSE 44 MCG/0.5ML SOAJ Use as directed by physician. Give 44 mcg (0.5 ml) as a subcutaneous injection three times per week.   PROTECT FROM LIGHT 12/31/17   Butch Penny, NP  repaglinide (PRANDIN) 1 MG tablet Take 2 mg by mouth 3 (three) times daily before meals.    [provider]  sitaGLIPtin (JANUVIA) 100 MG tablet Take 1 tablet (100 mg total) by mouth daily. 06/15/16   York Spaniel, MD  tamsulosin [facility]) 0.4 MG CAPS capsule Take by mouth. 11/17/15   [provider]  Testosterone (ANDROGEL TD) Place 1.62 % onto the skin daily.     [provider]  UNABLE TO FIND Take 1 tablet by mouth daily. Med Name: [facility]    [provider]    Physical Exam: Vitals:   03/13/18 1747 03/13/18 1809 03/13/18 1821 03/13/18 1853  BP: Marland Kitchen)  93/42 (!) 102/53 (!) 114/56 (!) 94/52  Pulse: 90 89 80 81  Resp: 16 16 16  (!) 22  Temp:    98.1 F (36.7 C)  TempSrc:    Oral  SpO2: 97% 98% 98% 97%  Weight:      Height:         General:  Appears calm and comfortable and is NAD Eyes:  PERRL, EOMI, normal lids, iris ENT:  grossly normal hearing, lips & tongue, mmm Neck:  no LAD, masses or thyromegaly Cardiovascular:  RRR, no m/r/g. No LE edema.  Respiratory:   CTA bilaterally with no wheezes/rales/rhonchi.  Normal respiratory effort. Abdomen:  soft, NT, ND, NABS Back:   normal alignment, no CVAT Skin:  no rash or induration seen on limited exam Musculoskeletal:  grossly normal tone BUE/BLE, good ROM, no bony abnormality Lower extremity:  No LE edema.  Limited foot exam with no ulcerations.  2+ distal pulses. Psychiatric:  blunted mood and affect, speech fluent and appropriate, AOx3 Neurologic:  CN 2-12 grossly intact, moves all extremities in coordinated fashion, sensation intact    Radiological Exams on Admission: Dg Chest 2 View  Result Date: 03/13/2018 CLINICAL DATA:  Malaise and fatigue EXAM: CHEST - 2 VIEW COMPARISON:  CT abdomen 07/23/2008 FINDINGS: There is mild bilateral interstitial prominence. There is no focal consolidation. There is no pleural effusion or pneumothorax. The cardiomediastinal silhouette is stable. The osseous structures are unremarkable. IMPRESSION: No active cardiopulmonary disease. Electronically Signed   By: Elige Ko   On: 03/13/2018 11:40    EKG: Independently reviewed.  NSR with rate 86; nonspecific ST changes with no evidence of acute ischemia   Labs on Admission: I have personally reviewed the available labs and imaging studies at the time of the admission.  Pertinent labs:     CO2 21 Glucose 153 BUN 53/Creatinine 5.01/GFR 13; 41/4.05/17 in the PCP office yesterday; 23/1.69/47 on 9/10; 20/1.36/54 in 7/19 Troponin 0.00 Lactate 1.87 WBC 6.0 Hgb 11.1; 13.3 on 9/10   Assessment/Plan Principal Problem:   Acute renal failure (ARF) (HCC) Active Problems:   Multiple sclerosis (HCC)   Diabetes mellitus type 2 in nonobese (HCC)   Acute renal failure -Baseline creatinine is 1.4, probable stage 3 CKD at baseline.   -Today's creatinine is 5.01 and BUN is 53 on admission. -Borderline hypotension while in the ER, fluid responsive -Does not have obvious volume loss (diarrhea) or inadequate PO intake to suggest prerenal cause and appears euvolemic on exam -He has been taking an ACE, Glucophage, Oxybutynin - hold  -Continue IVF hydration -Check FeNa - 1.63% indicating intrinsic renal disease -He did not initially make urine but also did not have much on bladder scan to indicate retention; he has subsequently been able to void -US-renal -Follow up renal function by BMP -Avoid ACEI and NSAIDs -He appears to need renal biopsy, pending results of renal US; his mother had ESRD with what is described as controlled HTN and only borderline DM and so there may be a genetic component at play here  MS -Hypotension may be autonomic dysfunction from MS -He is late for his annual brain/C-spine MRI -However, this seems less likely related to his acute presentation -He is on interferon for this condition  DM -Will check A1c -hold Glucophage, Prandin, Januvia -Continue Toujeo -Cover with moderate-scale SSI   DVT prophylaxis:  Lovenox Code Status:  Full - confirmed with patient/family Family Communication: Wife, daughter present throughout evaluation  Disposition Plan:  Home once clinically improved Consults  called: Nephrology  Admission status: Admit - It is my clinical opinion that admission to INPATIENT is reasonable and necessary because of the expectation that this  patient will require hospital care that crosses at least 2 midnights to treat this condition based on the medical complexity of the problems presented.  Given the aforementioned information, the predictability of an adverse outcome is felt to be significant.    Jonah Blue MD [facility]  If note is complete, please contact covering daytime or nighttime physician. [email or web address] Password [facility]  03/13/2018, 7:19 PM

## 2018-03-13 NOTE — ED Notes (Signed)
Bladder scanner showed bladder volume 

## 2018-03-13 NOTE — ED Triage Notes (Signed)
Pt arrives to ED after being called by his PCP because his potassium and glucose were high, unknown how high though. Pt is alert and oriented upon arrival, has recently had nausea and dry heaves but denies vomiting or diarrhea. Pt is known diabetic. Pt placed in position of comfort with call bell in reach.

## 2018-03-13 NOTE — ED Provider Notes (Addendum)
[facility] Emergency Department Provider Note MRN:  [id number]  Arrival date & time: 03/13/18     Chief Complaint   Abnormal Lab and Nausea   History of Present Illness   Derek Collins is a 67 y.o. year-old male with a history of MS, diabetes presenting to the ED with chief complaint of malaise, nausea.  General malaise and weakness for 1 week, progressively worsening.  Recent increased frequency of urination, but for the past 1 to 2 days decreased urine production.  Denies headache or vision change, no chest pain or shortness of breath, no abdominal pain.  No recent fever or cough, no diarrhea or vomiting.  Seen by PCP this morning, who sent him straight to the emergency department for acute renal failure, hyperkalemia.  Review of Systems  A complete 10 system review of systems was obtained and all systems are negative except as noted in the HPI and PMH.   Patient's Health History    Past Medical History:  Diagnosis Date  . Carpal tunnel syndrome, bilateral   . Chronic fatigue   . Chronic leg pain   . Chronic low back pain   . Depression   . Diabetes (HCC)   . Diabetic peripheral neuropathy (HCC)   . Gait disorder   . Macular degeneration of left eye   . Multiple sclerosis (HCC)   . Obesity   . Renal calculi   . Urinary frequency     Past Surgical History:  Procedure Laterality Date  . EYE SURGERY Right    cataract  . EYE SURGERY Right 07/2016   repair of previous surgery  . HERNIA REPAIR    . TONSILLECTOMY      Family History  Problem Relation Age of Onset  . Stroke Father   . Kidney failure Mother   . High blood pressure Mother   . Cancer Sister        Breast cancer/Renal cell carcinoma    Social History   Socioeconomic History  . Marital status: Married    Spouse name: Not on file  . Number of children: 2  . Years of education: 1-College  . Highest education level: Not on file  Occupational History  . Occupation: retired   Associate Professor: OTHER    Comment: Geneticist, molecular  . Financial resource strain: Not on file  . Food insecurity:    Worry: Not on file    Inability: Not on file  . Transportation needs:    Medical: Not on file    Non-medical: Not on file  Tobacco Use  . Smoking status: Former Smoker    Packs/day: 1.00    Years: 25.00    Pack years: 25.00    Types: Cigarettes  . Smokeless tobacco: Never Used  . Tobacco comment: Quit 30 years ago.  Substance and Sexual Activity  . Alcohol use: No    Alcohol/week: 0.0 standard drinks    Comment: Consumes beer on occasion  . Drug use: No  . Sexual activity: Not on file  Lifestyle  . Physical activity:    Days per week: Not on file    Minutes per session: Not on file  . Stress: Not on file  Relationships  . Social connections:    Talks on phone: Not on file    Gets together: Not on file    Attends religious service: Not on file    Active member of club or organization: Not on file    Attends  meetings of clubs or organizations: Not on file    Relationship status: Not on file  . Intimate partner violence:    Fear of current or ex partner: Not on file    Emotionally abused: Not on file    Physically abused: Not on file    Forced sexual activity: Not on file  Other Topics Concern  . Not on file  Social History Narrative   Patient lives at home with his wife Derek Collins.    Patient is disabled.   Education high school two years vocational.   Left handed.   Caffeine two soda's daily.     Physical Exam  Vital Signs and Nursing Notes reviewed Vitals:   03/13/18 1130 03/13/18 1200  BP: 103/68 (!) 95/48  Pulse: 84 96  Resp: 15 19  Temp:    SpO2: 97% 98%    CONSTITUTIONAL: Chronically ill-appearing, NAD NEURO:  Alert and oriented x 3, mild right-sided weakness (baseline) EYES:  eyes equal and reactive ENT/NECK:  no LAD, no JVD CARDIO: Regular rate, well-perfused, normal S1 and S2 PULM:  CTAB no wheezing or rhonchi GI/GU:  normal  bowel sounds, non-distended, non-tender MSK/SPINE:  No gross deformities, no edema SKIN:  no rash, atraumatic PSYCH:  Appropriate speech and behavior  Diagnostic and Interventional Summary    EKG Interpretation  Date/Time:  Thursday March 13 2018 10:57:28 EST Ventricular Rate:  86 PR Interval:    QRS Duration: 95 QT Interval:  345 QTC Calculation: 413 R Axis:   26 Text Interpretation:  Sinus rhythm Low voltage, precordial leads Minimal ST elevation, lateral leads Baseline wander in lead(s) V4 V5 V6 Confirmed by Kennis Carina [phone]) on 03/13/2018 11:10:29 AM      Labs Reviewed  CBC - Abnormal; Notable for the following components:      Result Value   RBC 3.82 (*)    Hemoglobin 11.1 (*)    HCT 34.5 (*)    All other components within normal limits  COMPREHENSIVE METABOLIC PANEL - Abnormal; Notable for the following components:   Sodium 134 (*)    CO2 21 (*)    Glucose, Bld 153 (*)    BUN 53 (*)    Creatinine, Ser 5.01 (*)    GFR calc non Af Amer 11 (*)    GFR calc Af Amer 13 (*)    All other components within normal limits  CBG MONITORING, ED - Abnormal; Notable for the following components:   Glucose-Capillary 145 (*)    All other components within normal limits  I-STAT CHEM 8, ED - Abnormal; Notable for the following components:   Sodium 133 (*)    BUN 57 (*)    Creatinine, Ser 5.00 (*)    Glucose, Bld 148 (*)    Hemoglobin 10.5 (*)    HCT 31.0 (*)    All other components within normal limits  BRAIN NATRIURETIC PEPTIDE  URINALYSIS, ROUTINE W REFLEX MICROSCOPIC  SODIUM, URINE, RANDOM  CREATININE, URINE, RANDOM  HIV ANTIBODY (ROUTINE TESTING W REFLEX)  HEMOGLOBIN A1C  I-STAT TROPONIN, ED  I-STAT CG4 LACTIC ACID, ED    DG Chest 2 View  Final Result    US RENAL    (Results Pending)    Medications  acetaminophen (TYLENOL) tablet 650 mg (has no administration in time range)    Or  acetaminophen (TYLENOL) suppository 650 mg (has no administration in time  range)  docusate sodium (COLACE) capsule 100 mg (has no administration in time range)  ondansetron (ZOFRAN) tablet  4 mg (has no administration in time range)    Or  ondansetron (ZOFRAN) injection 4 mg (has no administration in time range)  enoxaparin (LOVENOX) injection 30 mg (has no administration in time range)  lactated ringers infusion (has no administration in time range)  insulin aspart (novoLOG) injection 0-9 Units (has no administration in time range)  insulin aspart (novoLOG) injection 0-5 Units (has no administration in time range)  sodium chloride 0.9 % bolus 1,000 mL (0 mLs Intravenous Stopped 03/13/18 1428)     Procedures Critical Care Critical Care Documentation Critical care time provided by me (excluding procedures): 36 minutes  Condition necessitating critical care: Acute renal failure  Components of critical care management: reviewing of prior records, laboratory and imaging interpretation, frequent re-examination and reassessment of vital signs, administration of IV fluids, discussion with consulting services.    ED Course and Medical Decision Making  I have reviewed the triage vital signs and the nursing notes.  Pertinent labs & imaging results that were available during my care of the patient were reviewed by me and considered in my medical decision making (see below for details).  PCP with concern for acute renal failure and hyperkalemia in the 67 year old male with history of MS, history of diabetes.  There is also report of hyperglycemia in the PCPs office, in the 100s here in the ED.  EKG with no peak T waves, no QRS widening.  I-STAT chemistry pending.  Potassium 5.1, but patient's creatinine is 5.0, up from baseline of around 1.5.  Unclear origin of patient's acute renal failure, bladder scan reveals only 150 cc.  Admitted to medicine for further care.  Elmer Sow. Pilar Plate, MD [facility] Emergency [facility]  Health [email or web address]  Final Clinical Impressions(s) / ED Diagnoses     ICD-10-CM   1. Malaise and fatigue R53.81 DG Chest 2 View   R53.83 DG Chest 2 View  2. Acute renal failure (ARF) (HCC) N17.9 US RENAL    US RENAL    ED Discharge Orders    None         Sabas Sous, MD 03/13/18 1543    Sabas Sous, MD 03/31/18 1459

## 2018-03-13 NOTE — ED Notes (Signed)
Admitting physician bedside for evaluation and planning 

## 2018-03-13 NOTE — ED Notes (Signed)
Bladder Scan 113

## 2018-03-14 ENCOUNTER — Inpatient Hospital Stay (HOSPITAL_COMMUNITY): Payer: Medicare Other

## 2018-03-14 DIAGNOSIS — R5381 Other malaise: Secondary | ICD-10-CM

## 2018-03-14 DIAGNOSIS — R5383 Other fatigue: Secondary | ICD-10-CM

## 2018-03-14 DIAGNOSIS — E119 Type 2 diabetes mellitus without complications: Secondary | ICD-10-CM

## 2018-03-14 DIAGNOSIS — L8990 Pressure ulcer of unspecified site, unspecified stage: Secondary | ICD-10-CM

## 2018-03-14 DIAGNOSIS — G35 Multiple sclerosis: Secondary | ICD-10-CM

## 2018-03-14 LAB — CBC
HEMATOCRIT: 31.3 % — AB (ref 39.0–52.0)
HEMOGLOBIN: 10.2 g/dL — AB (ref 13.0–17.0)
MCH: 28.8 pg (ref 26.0–34.0)
MCHC: 32.6 g/dL (ref 30.0–36.0)
MCV: 88.4 fL (ref 80.0–100.0)
Platelets: 225 10*3/uL (ref 150–400)
RBC: 3.54 MIL/uL — AB (ref 4.22–5.81)
RDW: 13.5 % (ref 11.5–15.5)
WBC: 3.8 10*3/uL — ABNORMAL LOW (ref 4.0–10.5)
nRBC: 0 % (ref 0.0–0.2)

## 2018-03-14 LAB — BASIC METABOLIC PANEL
Anion gap: 5 (ref 5–15)
BUN: 44 mg/dL — AB (ref 8–23)
CHLORIDE: 113 mmol/L — AB (ref 98–111)
CO2: 20 mmol/L — AB (ref 22–32)
Calcium: 9.6 mg/dL (ref 8.9–10.3)
Creatinine, Ser: 3.84 mg/dL — ABNORMAL HIGH (ref 0.61–1.24)
GFR calc Af Amer: 17 mL/min — ABNORMAL LOW (ref >60)
GFR, EST NON AFRICAN AMERICAN: 15 mL/min — AB (ref >60)
GLUCOSE: 108 mg/dL — AB (ref 70–99)
POTASSIUM: 5.3 mmol/L — AB (ref 3.5–5.1)
Sodium: 138 mmol/L (ref 135–145)

## 2018-03-14 LAB — GLUCOSE, CAPILLARY
GLUCOSE-CAPILLARY: 252 mg/dL — AB (ref 70–99)
GLUCOSE-CAPILLARY: 159 mg/dL — AB (ref 70–99)
Glucose-Capillary: 57 mg/dL — ABNORMAL LOW (ref 70–99)
Glucose-Capillary: 96 mg/dL (ref 70–99)
Glucose-Capillary: 149 mg/dL — ABNORMAL HIGH (ref 70–99)

## 2018-03-14 LAB — CORTISOL-AM, BLOOD: CORTISOL - AM: 7.6 ug/dL (ref 6.7–22.6)

## 2018-03-14 LAB — HIV ANTIBODY (ROUTINE TESTING W REFLEX): HIV SCREEN 4TH GENERATION: NONREACTIVE

## 2018-03-14 NOTE — Progress Notes (Signed)
[facility]                                                                              Patient Demographics  [name], is a 67 y.o. male, DOB - 03/04/1951, BMW:[id number]  Admit date - 03/13/2018   Admitting Physician Jonah Blue, MD  Outpatient Primary MD for the patient is Joette Catching, MD  Outpatient specialists:   LOS - 1  days   Medical records reviewed and are as summarized below:    Chief Complaint  Patient presents with  . Abnormal Lab  . Nausea       Brief summary   Patient is a 67 year old male with history of MS, diabetes mellitus, depression who went to his PCP a day before the admission and was recommended to come to the ED for abnormal labs.,  Creatinine was 5, sodium 133, potassium 5.1 bicarb 21.  He was somewhat hypotensive with SBP in 90s. Patient states he had been in normal state of health until a day before the admission when he noted last urine output but no dysuria or hematuria.  No fevers or chills or abdominal pain.  He did feel some dizziness.  Patient was in ED on 12/31/2017 with hyperglycemia with CBG 700.  Creatinine at that time was 1.6   Assessment & Plan    Principal Problem:   Acute renal failure (ARF) (HCC) on CKD stage II -Creatinine 1.5-1.6, on admission 5.01 -Unclear etiology, except that patient was on ACE inhibitor, denies any excessive NSAID use, diarrhea or vomiting. -Patient was placed on IV fluid hydration, creatinine improving, 3.8 today -Renal ultrasound negative for any obstruction or hydronephrosis, UA negative for any UTI or proteinuria -Continue IV fluids  Active Problems:  Hypotension -Hold ACE inhibitor, random cortisol level borderline, will order ACTH stim test, TSH -Continue IV fluid hydration, obtain orthostatics  History of multiple sclerosis (HCC) -Possibly causing autonomic dysfunction and hypotension but no acute flare -On interferon    Diabetes mellitus type 2 in nonobese (HCC),  uncontrolled with hyperglycemia -A1c 9.6 -Hold Glucophage, Prandin, Januvia while inpatient, continue Toujeo, sliding scale insulin   Code Status: Full CODE STATUS DVT Prophylaxis: Lovenox Family Communication: Discussed in detail with the patient, all imaging results, lab results explained to the patient    Disposition Plan: Once creatinine function stabilized, hopefully next 24 hours  Time Spent in minutes 25  Procedures:  Renal ultrasound  Consultants:   Nephrology  Antimicrobials:      Medications  Scheduled Meds: . docusate sodium  100 mg Oral BID  . DULoxetine  30 mg Oral Daily  . DULoxetine  60 mg Oral Daily  . enoxaparin (LOVENOX) injection  30 mg Subcutaneous Q24H  . gabapentin  400 mg Oral TID  . insulin aspart  0-5 Units Subcutaneous QHS  . insulin aspart  0-9 Units Subcutaneous TID WC  . insulin glargine  50 Units Subcutaneous QHS  . pravastatin  40 mg Oral Daily  . tamsulosin  0.4 mg Oral Daily   Continuous Infusions: . sodium chloride 100 mL/hr at 03/14/18 0537   PRN Meds:.acetaminophen **OR** acetaminophen, ondansetron **OR** ondansetron (ZOFRAN) IV  Antibiotics   Anti-infectives (From admission, onward)   None        Subjective:   Derek Collins was seen and examined today.  Denies any specific complaints, eating breakfast, no abdominal pain.  No fevers. Patient denies dizziness, chest pain, shortness of breath, abdominal pain, N/V/D/C, new weakness, numbess, tingling. No acute events overnight.    Objective:   Vitals:   03/13/18 1853 03/13/18 2116 03/14/18 0334 03/14/18 0919  BP: (!) 94/52 (!) 101/54 106/64 113/65  Pulse: 81 84 88 70  Resp: (!) 22 18 16 19   Temp: 98.1 F (36.7 C) 98.2 F (36.8 C) 98.3 F (36.8 C) 98.3 F (36.8 C)  TempSrc: Oral Oral Oral Oral  SpO2: 97% 99% 95% 93%  Weight:  86.4 kg    Height:        Intake/Output Summary (Last 24 hours) at 03/14/2018 1050 Last data filed at 03/14/2018 0945 Gross per 24 hour   Intake 1921.33 ml  Output 700 ml  Net 1221.33 ml     Wt Readings from Last 3 Encounters:  03/13/18 86.4 kg  12/30/17 81.9 kg  06/17/17 89.4 kg     Exam  General: Alert and oriented x 3, NAD  Eyes:   HEENT:  Atraumatic, normocephalic, normal oropharynx  Cardiovascular: S1 S2 auscultated, Regular rate and rhythm.  Respiratory: Clear to auscultation bilaterally, no wheezing, rales or rhonchi  Gastrointestinal: Soft, nontender, nondistended, + bowel sounds  Ext: no pedal edema bilaterally  Neuro: no new deficits  Musculoskeletal: No digital cyanosis, clubbing  Skin: No rashes  Psych: Normal affect and demeanor, alert and oriented x3    Data Reviewed:  I have personally reviewed following labs and imaging studies  Micro Results No results found for this or any previous visit (from the past 240 hour(s)).  Radiology Reports Dg Chest 2 View  Result Date: 03/13/2018 CLINICAL DATA:  Malaise and fatigue EXAM: CHEST - 2 VIEW COMPARISON:  CT abdomen 07/23/2008 FINDINGS: There is mild bilateral interstitial prominence. There is no focal consolidation. There is no pleural effusion or pneumothorax. The cardiomediastinal silhouette is stable. The osseous structures are unremarkable. IMPRESSION: No active cardiopulmonary disease. Electronically Signed   By: Elige Ko   On: 03/13/2018 11:40   US Renal  Result Date: 03/14/2018 CLINICAL DATA:  Acute renal failure. EXAM: RENAL / URINARY TRACT ULTRASOUND COMPLETE COMPARISON:  CT abdomen and pelvis 10/07/2008 FINDINGS: Right Kidney: Renal measurements: 12.2 x 5.8 x 5.9 cm = volume: 220 mL . Echogenicity within normal limits. No mass or hydronephrosis visualized. Left Kidney: Renal measurements: 12.9 x 5.6 x 5.5 cm = volume: 213 mL. Echogenicity within normal limits. No mass or hydronephrosis visualized. Bladder: Appears normal for degree of bladder distention. IMPRESSION: Normal ultrasound appearance of the kidneys.  No  hydronephrosis. Electronically Signed   By: Burman Nieves M.D.   On: 03/14/2018 04:45    Lab Data:  CBC: Recent Labs  Lab 03/13/18 1111 03/13/18 1126 03/14/18 0648  WBC 6.0  --  3.8*  HGB 11.1* 10.5* 10.2*  HCT 34.5* 31.0* 31.3*  MCV 90.3  --  88.4  PLT 234  --  225   Basic Metabolic Panel: Recent Labs  Lab 03/13/18 1111 03/13/18 1126 03/14/18 0648  NA 134* 133* 138  K 5.1 5.1 5.3*  CL 102 101 113*  CO2 21*  --  20*  GLUCOSE 153* 148* 108*  BUN 53* 57* 44*  CREATININE 5.01* 5.00* 3.84*  CALCIUM 9.8  --  9.6  GFR: Estimated Creatinine Clearance: 20.2 mL/min (A) (by C-G formula based on SCr of 3.84 mg/dL (H)). Liver Function Tests: Recent Labs  Lab 03/13/18 1111  AST 26  ALT 20  ALKPHOS 79  BILITOT 0.8  PROT 6.5  ALBUMIN 3.7   No results for input(s): LIPASE, AMYLASE in the last 168 hours. No results for input(s): AMMONIA in the last 168 hours. Coagulation Profile: No results for input(s): INR, PROTIME in the last 168 hours. Cardiac Enzymes: No results for input(s): CKTOTAL, CKMB, CKMBINDEX, TROPONINI in the last 168 hours. BNP (last 3 results) No results for input(s): PROBNP in the last 8760 hours. HbA1C: Recent Labs    03/13/18 2047  HGBA1C 9.6*   CBG: Recent Labs  Lab 03/13/18 1052 03/13/18 1654 03/13/18 2119 03/14/18 0731  GLUCAP 145* 73 107* 96   Lipid Profile: No results for input(s): CHOL, HDL, LDLCALC, TRIG, CHOLHDL, LDLDIRECT in the last 72 hours. Thyroid Function Tests: No results for input(s): TSH, T4TOTAL, FREET4, T3FREE, THYROIDAB in the last 72 hours. Anemia Panel: No results for input(s): VITAMINB12, FOLATE, FERRITIN, TIBC, IRON, RETICCTPCT in the last 72 hours. Urine analysis:    Component Value Date/Time   COLORURINE YELLOW 03/13/2018 1112   APPEARANCEUR CLEAR 03/13/2018 1112   LABSPEC 1.008 03/13/2018 1112   PHURINE 5.0 03/13/2018 1112   GLUCOSEU NEGATIVE 03/13/2018 1112   HGBUR NEGATIVE 03/13/2018 1112    BILIRUBINUR NEGATIVE 03/13/2018 1112   KETONESUR NEGATIVE 03/13/2018 1112   PROTEINUR NEGATIVE 03/13/2018 1112   NITRITE NEGATIVE 03/13/2018 1112   LEUKOCYTESUR NEGATIVE 03/13/2018 1112     Ripudeep Rai M.D. [facility] 03/14/2018, 10:50 AM  Pager: [phone] Between 7am to 7pm - call Pager - [phone]  After 7pm go to [email or web address] - password [email or web address]  Call night coverage person covering after 7pm

## 2018-03-14 NOTE — Progress Notes (Signed)
Patient ID: Derek Collins, male   DOB: Apr 21, 1951, 67 y.o.   MRN: [id number] [name] Progress Note   Assessment/ Plan:   1.  Acute kidney injury: Unclear etiology but appears to be hemodynamically mediated given response to intravenous fluids overnight.  Satisfactory urine output with borderline elevated potassium level which I will continue to follow.  No evidence of obstruction on renal ultrasound and urine electrolytes pending.  We will continue intravenous fluids at this time at current rate for the next 24 hours or so.  Blood pressures noted to be improving. 2.  Hypotension: ACE inhibitor on hold, possible that he may have an element of orthostatic hypotension from autonomic dysfunction-would avoid RAS blockade given propensity for this in the future.  Random cortisol level is concerningly low and he may need a formal cortisol stim test. 3.  Hyperkalemia: Mild, monitor with intravenous fluids/improving urine output. 4.  Anemia: Hemoglobin/hematocrit noted to be trending down with intravenous fluids-no overt losses.  Subjective:   Reports to be feeling better and inquires about discharge home.  Denies any chest pain or shortness of breath.   Objective:   BP 113/65 (BP Location: Right Arm)   Pulse 70   Temp 98.3 F (36.8 C) (Oral)   Resp 19   Ht 5\' 11"  (1.803 m)   Wt 86.4 kg   SpO2 93%   BMI 26.57 kg/m   Intake/Output Summary (Last 24 hours) at 03/14/2018 1002 Last data filed at 03/14/2018 0945 Gross per 24 hour  Intake 1921.33 ml  Output 700 ml  Net 1221.33 ml   Weight change:   Physical Exam: Gen: Comfortably resting in bed, family members at bedside CVS: Pulse regular rhythm, normal rate, S1 and S2 normal Resp: Clear to auscultation, no rales/rhonchi Abd: Soft, flat, nontender Ext: No lower extremity edema  Imaging: Dg Chest 2 View  Result Date: 03/13/2018 CLINICAL DATA:  Malaise and fatigue EXAM: CHEST - 2 VIEW COMPARISON:  CT abdomen 07/23/2008  FINDINGS: There is mild bilateral interstitial prominence. There is no focal consolidation. There is no pleural effusion or pneumothorax. The cardiomediastinal silhouette is stable. The osseous structures are unremarkable. IMPRESSION: No active cardiopulmonary disease. Electronically Signed   By: Elige Ko   On: 03/13/2018 11:40   US Renal  Result Date: 03/14/2018 CLINICAL DATA:  Acute renal failure. EXAM: RENAL / URINARY TRACT ULTRASOUND COMPLETE COMPARISON:  CT abdomen and pelvis 10/07/2008 FINDINGS: Right Kidney: Renal measurements: 12.2 x 5.8 x 5.9 cm = volume: 220 mL . Echogenicity within normal limits. No mass or hydronephrosis visualized. Left Kidney: Renal measurements: 12.9 x 5.6 x 5.5 cm = volume: 213 mL. Echogenicity within normal limits. No mass or hydronephrosis visualized. Bladder: Appears normal for degree of bladder distention. IMPRESSION: Normal ultrasound appearance of the kidneys.  No hydronephrosis. Electronically Signed   By: Burman Nieves M.D.   On: 03/14/2018 04:45    Labs: BMET Recent Labs  Lab 03/13/18 1111 03/13/18 1126 03/14/18 0648  NA 134* 133* 138  K 5.1 5.1 5.3*  CL 102 101 113*  CO2 21*  --  20*  GLUCOSE 153* 148* 108*  BUN 53* 57* 44*  CREATININE 5.01* 5.00* 3.84*  CALCIUM 9.8  --  9.6   CBC Recent Labs  Lab 03/13/18 1111 03/13/18 1126 03/14/18 0648  WBC 6.0  --  3.8*  HGB 11.1* 10.5* 10.2*  HCT 34.5* 31.0* 31.3*  MCV 90.3  --  88.4  PLT 234  --  225  Medications:    . docusate sodium  100 mg Oral BID  . DULoxetine  30 mg Oral Daily  . DULoxetine  60 mg Oral Daily  . enoxaparin (LOVENOX) injection  30 mg Subcutaneous Q24H  . gabapentin  400 mg Oral TID  . insulin aspart  0-5 Units Subcutaneous QHS  . insulin aspart  0-9 Units Subcutaneous TID WC  . insulin glargine  50 Units Subcutaneous QHS  . pravastatin  40 mg Oral Daily  . tamsulosin  0.4 mg Oral Daily   Zetta Bills, MD 03/14/2018, 10:02 AM

## 2018-03-15 LAB — RENAL FUNCTION PANEL
ALBUMIN: 3.2 g/dL — AB (ref 3.5–5.0)
Anion gap: 6 (ref 5–15)
BUN: 31 mg/dL — AB (ref 8–23)
CALCIUM: 9.7 mg/dL (ref 8.9–10.3)
CO2: 20 mmol/L — ABNORMAL LOW (ref 22–32)
CREATININE: 2.34 mg/dL — AB (ref 0.61–1.24)
Chloride: 111 mmol/L (ref 98–111)
GFR, EST AFRICAN AMERICAN: 32 mL/min — AB (ref >60)
GFR, EST NON AFRICAN AMERICAN: 27 mL/min — AB (ref >60)
Glucose, Bld: 207 mg/dL — ABNORMAL HIGH (ref 70–99)
PHOSPHORUS: 3.3 mg/dL (ref 2.5–4.6)
Potassium: 5.1 mmol/L (ref 3.5–5.1)
SODIUM: 137 mmol/L (ref 135–145)

## 2018-03-15 LAB — CBC
HEMATOCRIT: 33.1 % — AB (ref 39.0–52.0)
HEMOGLOBIN: 10.3 g/dL — AB (ref 13.0–17.0)
MCH: 28.3 pg (ref 26.0–34.0)
MCHC: 31.1 g/dL (ref 30.0–36.0)
MCV: 90.9 fL (ref 80.0–100.0)
PLATELETS: 273 10*3/uL (ref 150–400)
RBC: 3.64 MIL/uL — AB (ref 4.22–5.81)
RDW: 13.5 % (ref 11.5–15.5)
WBC: 3.9 10*3/uL — ABNORMAL LOW (ref 4.0–10.5)
nRBC: 0 % (ref 0.0–0.2)

## 2018-03-15 LAB — ACTH STIMULATION, 3 TIME POINTS
CORTISOL 60 MIN: 25.2 ug/dL
Cortisol, 30 Min: 24.1 ug/dL
Cortisol, Base: 11.1 ug/dL

## 2018-03-15 LAB — TSH: TSH: 1.365 u[IU]/mL (ref 0.350–4.500)

## 2018-03-15 LAB — GLUCOSE, CAPILLARY
GLUCOSE-CAPILLARY: 184 mg/dL — AB (ref 70–99)
Glucose-Capillary: 95 mg/dL (ref 70–99)

## 2018-03-15 MED ORDER — INSULIN GLARGINE 300 UNIT/ML ~~LOC~~ SOPN: 50.0000 [IU] | PEN_INJECTOR | Freq: Every day | SUBCUTANEOUS | Status: AC

## 2018-03-15 MED ORDER — COSYNTROPIN 0.25 MG IJ SOLR
0.2500 mg | Freq: Once | INTRAMUSCULAR | Status: AC
  Administered 2018-03-15: 0.25 mg via INTRAVENOUS
  Filled 2018-03-15: qty 0.25

## 2018-03-15 NOTE — Progress Notes (Signed)
Derek Collins to be D/C'd Home per MD order.  Discussed prescriptions and follow up appointments with the patient. Prescriptions given to patient, medication list explained in detail. Pt verbalized understanding.  Allergies as of 03/15/2018   No Known Allergies     Medication List    STOP taking these medications   lisinopril 10 MG tablet Commonly known as:  PRINIVIL,ZESTRIL   metFORMIN 500 MG 24 hr tablet Commonly known as:  GLUCOPHAGE-XR     TAKE these medications   DULoxetine 60 MG capsule Commonly known as:  CYMBALTA Take 1 capsule (60 mg total) by mouth 2 (two) times daily. What changed:  when to take this   DULoxetine 30 MG capsule Commonly known as:  CYMBALTA Take 30 mg by mouth daily. What changed:  Another medication with the same name was changed. Make sure you understand how and when to take each.   gabapentin 400 MG capsule Commonly known as:  NEURONTIN Take 1 capsule (400 mg total) by mouth 3 (three) times daily.   Insulin Glargine 300 UNIT/ML Sopn Inject 50 Units into the skin at bedtime.   ONE TOUCH ULTRA TEST test strip Generic drug:  glucose blood 1 each by Other route 3 (three) times daily. -Contour   oxybutynin 5 MG tablet Commonly known as:  DITROPAN TAKE 1 TABLET BY MOUTH THREE TIMES DAILY   pravastatin 40 MG tablet Commonly known as:  PRAVACHOL Take 40 mg by mouth daily.   REBIF REBIDOSE 44 MCG/0.5ML Soaj Generic drug:  Interferon Beta-1a Use as directed by physician. Give 44 mcg (0.5 ml) as a subcutaneous injection three times per week.   PROTECT FROM LIGHT What changed:  See the new instructions.   repaglinide 2 MG tablet Commonly known as:  PRANDIN Take 2 mg by mouth 3 (three) times daily before meals.   sitaGLIPtin 100 MG tablet Commonly known as:  JANUVIA Take 1 tablet (100 mg total) by mouth daily. What changed:  when to take this   tamsulosin 0.4 MG Caps capsule Commonly known as:  FLOMAX Take 0.4 mg by mouth daily.        Vitals:   03/15/18 0510 03/15/18 0738  BP: 131/70 129/72  Pulse: 71 65  Resp: 18 18  Temp: 97.6 F (36.4 C) (!) 97.5 F (36.4 C)  SpO2: 95% 97%    Skin clean, dry and intact without evidence of skin break down, no evidence of skin tears noted. IV catheter discontinued intact. Site without signs and symptoms of complications. Dressing and pressure applied. Pt denies pain at this time. No complaints noted.  An After Visit Summary was printed and given to the patient. Patient escorted via WC, and D/C home via private auto.  Margretta Sidle, RN

## 2018-03-15 NOTE — Discharge Summary (Signed)
Physician Discharge Summary   Patient ID: DEMETRE CANDANOZA MRN: [id number] DOB/AGE: [birth year] 67 y.o.  Admit date: 03/13/2018 Discharge date: 03/15/2018  Primary Care Physician:  Joette Catching, MD   Recommendations for Outpatient Follow-up:  1. Follow up with PCP in 1-2 weeks 2. Please obtain BMP to follow on renal function 3. Lisinopril discontinued, patient advised to hold metformin until creatinine function has improved to normal  Home Health: None  Equipment/Devices:   Discharge Condition: stable CODE STATUS: FULL  Diet recommendation: Carb modified diet   Discharge Diagnoses:   Present on Admission: . Acute kidney injury on CKD stage II . Multiple sclerosis (HCC) Hyperkalemia Anemia of chronic disease Type 2 diabetes mellitus, insulin-dependent Hypotension  Consults: Nephrology    Allergies:  No Known Allergies   DISCHARGE MEDICATIONS: Allergies as of 03/15/2018   No Known Allergies     Medication List    STOP taking these medications   lisinopril 10 MG tablet Commonly known as:  PRINIVIL,ZESTRIL   metFORMIN 500 MG 24 hr tablet Commonly known as:  GLUCOPHAGE-XR     TAKE these medications   DULoxetine 60 MG capsule Commonly known as:  CYMBALTA Take 1 capsule (60 mg total) by mouth 2 (two) times daily. What changed:  when to take this   DULoxetine 30 MG capsule Commonly known as:  CYMBALTA Take 30 mg by mouth daily. What changed:  Another medication with the same name was changed. Make sure you understand how and when to take each.   gabapentin 400 MG capsule Commonly known as:  NEURONTIN Take 1 capsule (400 mg total) by mouth 3 (three) times daily.   Insulin Glargine 300 UNIT/ML Sopn Inject 50 Units into the skin at bedtime.   ONE TOUCH ULTRA TEST test strip Generic drug:  glucose blood 1 each by Other route 3 (three) times daily. -Contour   oxybutynin 5 MG tablet Commonly known as:  DITROPAN TAKE 1 TABLET BY MOUTH THREE TIMES  DAILY   pravastatin 40 MG tablet Commonly known as:  PRAVACHOL Take 40 mg by mouth daily.   REBIF REBIDOSE 44 MCG/0.5ML Soaj Generic drug:  Interferon Beta-1a Use as directed by physician. Give 44 mcg (0.5 ml) as a subcutaneous injection three times per week.   PROTECT FROM LIGHT What changed:  See the new instructions.   repaglinide 2 MG tablet Commonly known as:  PRANDIN Take 2 mg by mouth 3 (three) times daily before meals.   sitaGLIPtin 100 MG tablet Commonly known as:  JANUVIA Take 1 tablet (100 mg total) by mouth daily. What changed:  when to take this   tamsulosin 0.4 MG Caps capsule Commonly known as:  FLOMAX Take 0.4 mg by mouth daily.        Brief H and P: For complete details please refer to admission H and P, but in brief Patient is a 67 year old male with history of MS, diabetes mellitus, depression who went to his PCP a day before the admission and was recommended to come to the ED for abnormal labs.,  Creatinine was 5, sodium 133, potassium 5.1 bicarb 21.  He was somewhat hypotensive with SBP in 90s. Patient states he had been in normal state of health until a day before the admission when he noted last urine output but no dysuria or hematuria.  No fevers or chills or abdominal pain.  He did feel some dizziness.  Patient was in ED on 12/31/2017 with hyperglycemia with CBG 700.  Creatinine at that time was  1.6   Hospital Course:   Acute renal failure (ARF) (HCC) on CKD stage II with hyperkalemia -Creatinine 1.5-1.6, on admission 5.01 with hyperkalemia.  -Unclear etiology, except that patient was on ACE inhibitor and metformin, denies any excessive NSAID use, diarrhea or vomiting. -He was placed on IV fluid hydration, creatinine has improved to 2.3 today.  Cleared for discharge by nephrology.  Lisinopril discontinued, patient was recommended to hold metformin until creatinine function has normalized.  -Renal ultrasound negative for any obstruction or  hydronephrosis, UA negative for any UTI or proteinuria   Hypotension -ACE inhibitor discontinued, orthostatic vital signs negative, cosyntropin test negative  History of multiple sclerosis (HCC) -Possibly causing autonomic dysfunction and hypotension but no acute flare -On interferon    Diabetes mellitus type 2 in nonobese (HCC), uncontrolled with hyperglycemia -A1c 9.6 Patient recommended to continue Lantus, Prandin, Januvia -Hold metformin.   Day of Discharge S: Feels a lot better today no new complaints.,  Wants to go home  BP 129/72 (BP Location: Left Arm)   Pulse 65   Temp (!) 97.5 F (36.4 C) (Oral)   Resp 18   Ht 5\' 11"  (1.803 m)   Wt 86.4 kg   SpO2 97%   BMI 26.57 kg/m   Physical Exam: General: Alert and awake oriented x3 not in any acute distress. HEENT: anicteric sclera, pupils reactive to light and accommodation CVS: S1-S2 clear no murmur rubs or gallops Chest: clear to auscultation bilaterally, no wheezing rales or rhonchi Abdomen: soft nontender, nondistended, normal bowel sounds Extremities: no cyanosis, clubbing or edema noted bilaterally Neuro: Cranial nerves II-XII intact, no focal neurological deficits   The results of significant diagnostics from this hospitalization (including imaging, microbiology, ancillary and laboratory) are listed below for reference.      Procedures/Studies:  Dg Chest 2 View  Result Date: 03/13/2018 CLINICAL DATA:  Malaise and fatigue EXAM: CHEST - 2 VIEW COMPARISON:  CT abdomen 07/23/2008 FINDINGS: There is mild bilateral interstitial prominence. There is no focal consolidation. There is no pleural effusion or pneumothorax. The cardiomediastinal silhouette is stable. The osseous structures are unremarkable. IMPRESSION: No active cardiopulmonary disease. Electronically Signed   By: Elige Ko   On: 03/13/2018 11:40   US Renal  Result Date: 03/14/2018 CLINICAL DATA:  Acute renal failure. EXAM: RENAL / URINARY TRACT  ULTRASOUND COMPLETE COMPARISON:  CT abdomen and pelvis 10/07/2008 FINDINGS: Right Kidney: Renal measurements: 12.2 x 5.8 x 5.9 cm = volume: 220 mL . Echogenicity within normal limits. No mass or hydronephrosis visualized. Left Kidney: Renal measurements: 12.9 x 5.6 x 5.5 cm = volume: 213 mL. Echogenicity within normal limits. No mass or hydronephrosis visualized. Bladder: Appears normal for degree of bladder distention. IMPRESSION: Normal ultrasound appearance of the kidneys.  No hydronephrosis. Electronically Signed   By: Burman Nieves M.D.   On: 03/14/2018 04:45       LAB RESULTS: Basic Metabolic Panel: Recent Labs  Lab 03/14/18 0648 03/15/18 0921  NA 138 137  K 5.3* 5.1  CL 113* 111  CO2 20* 20*  GLUCOSE 108* 207*  BUN 44* 31*  CREATININE 3.84* 2.34*  CALCIUM 9.6 9.7  PHOS  --  3.3   Liver Function Tests: Recent Labs  Lab 03/13/18 1111 03/15/18 0921  AST 26  --   ALT 20  --   ALKPHOS 79  --   BILITOT 0.8  --   PROT 6.5  --   ALBUMIN 3.7 3.2*   No results for input(s): LIPASE, AMYLASE  in the last 168 hours. No results for input(s): AMMONIA in the last 168 hours. CBC: Recent Labs  Lab 03/14/18 0648 03/15/18 0921  WBC 3.8* 3.9*  HGB 10.2* 10.3*  HCT 31.3* 33.1*  MCV 88.4 90.9  PLT 225 273   Cardiac Enzymes: No results for input(s): CKTOTAL, CKMB, CKMBINDEX, TROPONINI in the last 168 hours. BNP: Invalid input(s): POCBNP CBG: Recent Labs  Lab 03/15/18 0740 03/15/18 1157  GLUCAP 95 184*      Disposition and Follow-up: Discharge Instructions    Diet Carb Modified   Complete by:  As directed    Discharge instructions   Complete by:  As directed    Please note that lisinopril has been discontinued.  Continue to hold metformin until the kidney function is completely back to your normal.  Please get your labs checked next week with your primary care physician for the kidney function.   Increase activity slowly   Complete by:  As directed         DISPOSITION: Home   DISCHARGE FOLLOW-UP Follow-up Information    Joette Catching, MD. Schedule an appointment as soon as possible for a visit in 10 day(s).   Specialty:  Family Medicine Contact information: [address] [place] [address] [phone]            Time coordinating discharge:  25 minutes Signed:   Thad Ranger M.D. [facility] 03/15/2018, 12:17 PM Pager: [phone]

## 2018-03-15 NOTE — Progress Notes (Signed)
Patient ID: Derek Collins, male   DOB: 1950-05-12, 67 y.o.   MRN: [id number]  [facility] Progress Note   Assessment/ Plan:   1.  Acute kidney injury: Unclear etiology but appears to be hemodynamically mediated good response to intravenous fluid seen earlier and excellent urine output overnight.  Labs from this morning are pending to assess for renal recovery/electrolyte abnormalities.  Discontinue IV fluids. 2.  Hypotension: ACE inhibitor on hold, possible that he may have an element of orthostatic hypotension from autonomic dysfunction-would avoid RAS blockade given propensity for this in the future.  ACTH stimulation test today. 3.  Hyperkalemia: Mild, will check labs this morning to determine need for intervention. 4.  Anemia: Hemoglobin/hematocrit noted to be trending down with intravenous fluids-no overt losses.  Subjective:   Reports to be feeling better and reports that he has not had any labs drawn this morning.   Objective:   BP 129/72 (BP Location: Left Arm)   Pulse 65   Temp (!) 97.5 F (36.4 C) (Oral)   Resp 18   Ht 5\' 11"  (1.803 m)   Wt 86.4 kg   SpO2 97%   BMI 26.57 kg/m   Intake/Output Summary (Last 24 hours) at 03/15/2018 0910 Last data filed at 03/15/2018 0900 Gross per 24 hour  Intake 3638.19 ml  Output 4500 ml  Net -861.81 ml   Weight change:   Physical Exam: Gen: Comfortably resting in bed, watching television CVS: Pulse regular rhythm, normal rate, S1 and S2 normal Resp: Clear to auscultation, no rales/rhonchi Abd: Soft, flat, nontender Ext: No lower extremity edema  Imaging: Dg Chest 2 View  Result Date: 03/13/2018 CLINICAL DATA:  Malaise and fatigue EXAM: CHEST - 2 VIEW COMPARISON:  CT abdomen 07/23/2008 FINDINGS: There is mild bilateral interstitial prominence. There is no focal consolidation. There is no pleural effusion or pneumothorax. The cardiomediastinal silhouette is stable. The osseous structures are unremarkable. IMPRESSION:  No active cardiopulmonary disease. Electronically Signed   By: Elige Ko   On: 03/13/2018 11:40   US Renal  Result Date: 03/14/2018 CLINICAL DATA:  Acute renal failure. EXAM: RENAL / URINARY TRACT ULTRASOUND COMPLETE COMPARISON:  CT abdomen and pelvis 10/07/2008 FINDINGS: Right Kidney: Renal measurements: 12.2 x 5.8 x 5.9 cm = volume: 220 mL . Echogenicity within normal limits. No mass or hydronephrosis visualized. Left Kidney: Renal measurements: 12.9 x 5.6 x 5.5 cm = volume: 213 mL. Echogenicity within normal limits. No mass or hydronephrosis visualized. Bladder: Appears normal for degree of bladder distention. IMPRESSION: Normal ultrasound appearance of the kidneys.  No hydronephrosis. Electronically Signed   By: Burman Nieves M.D.   On: 03/14/2018 04:45    Labs: BMET Recent Labs  Lab 03/13/18 1111 03/13/18 1126 03/14/18 0648  NA 134* 133* 138  K 5.1 5.1 5.3*  CL 102 101 113*  CO2 21*  --  20*  GLUCOSE 153* 148* 108*  BUN 53* 57* 44*  CREATININE 5.01* 5.00* 3.84*  CALCIUM 9.8  --  9.6   CBC Recent Labs  Lab 03/13/18 1111 03/13/18 1126 03/14/18 0648  WBC 6.0  --  3.8*  HGB 11.1* 10.5* 10.2*  HCT 34.5* 31.0* 31.3*  MCV 90.3  --  88.4  PLT 234  --  225   Medications:    . cosyntropin  0.25 mg Intravenous Once  . docusate sodium  100 mg Oral BID  . DULoxetine  30 mg Oral Daily  . DULoxetine  60 mg Oral Daily  . enoxaparin (  LOVENOX) injection  30 mg Subcutaneous Q24H  . gabapentin  400 mg Oral TID  . insulin aspart  0-5 Units Subcutaneous QHS  . insulin aspart  0-9 Units Subcutaneous TID WC  . insulin glargine  50 Units Subcutaneous QHS  . pravastatin  40 mg Oral Daily  . tamsulosin  0.4 mg Oral Daily   Zetta Bills, MD 03/15/2018, 9:10 AM

## 2018-03-23 ENCOUNTER — Other Ambulatory Visit: Payer: Self-pay | Admitting: Adult Health

## 2018-04-30 ENCOUNTER — Telehealth: Payer: Self-pay | Admitting: *Deleted

## 2018-04-30 NOTE — Telephone Encounter (Signed)
Received fax from [facility] stating the rebif Rx has exceeded plan limits of 12 syringes for 28 days. [employer] Rx PA dept., spoke with Cyndia Bent who stated PA is not required per her end. She stated his Rx is for 12 syringes/28 days (he takes one syringe 3 x weekly). This RN thanked her.  Called [facility], spoke with [place] who stated the order is ready to be processed, no indicaiton of PA needed. They reached out to patient with a text, but have not gotten a response.  This RN called home phone twice; call answered but no one spoke. Called daughter, Samara Deist on [place] and advised her that this RN couldn't reach patient. Advised her the pharmacy is waiting on him to call and schedule shipment. She stated he doesn't need refill and that he was told he didn't have patient assistance any longer.  This RN advised her, per note 01/01/18 that a VM was left advising him to pick up new paperwork to apply; advised her he must reapply yearly. She asked if papers could be mailed to his home. This RN advised Samara Deist she will mail them. She Patient verbalized understanding, appreciation.  Papers found at front desk and put in mail to patient's home today.

## 2018-05-28 ENCOUNTER — Other Ambulatory Visit: Payer: Self-pay | Admitting: Adult Health

## 2018-07-22 ENCOUNTER — Telehealth: Payer: Self-pay | Admitting: Adult Health

## 2018-07-22 NOTE — Telephone Encounter (Signed)
[facility] [phone] needs to know if the patient therapy has changed, if the patient is still takingREBIF REBIDOSE 44 MCG/0.5ML SOAJ . He said the patient should have about 2 weeks left of the medication, they have tried to refill it but was told not ready yet. It has not been refilled since November 2019. Please call to advise

## 2018-07-22 NOTE — Telephone Encounter (Signed)
Events noted

## 2018-07-22 NOTE — Telephone Encounter (Signed)
I called pt and spoke to wife, on DPR.  Per Samara Deist, she states that the pt is being stubborn and taking the rebif injection only once weekly.  She states they do not need refills at this time, he still has some.  I relayed that ordered as maintenance dosing as 3 times a week. (MWF).  Wife and daughter aware.  Pt takes only once weekly.  I would relay to provider, Dr. Anne Hahn.  I called and spoke to [place], with [facility].  Relayed that pt is taking only once week, but this not per providers order.  She will not discharge pt, but would appreciate resolution (as change in order).  I relayed no change at this time (keep as 3 x week injection).  She verbalized understanding.  They did receive PAP p/w, but have not filled out.  I reiterated that would fill out and send in.  She said they would look into it.

## 2018-08-27 ENCOUNTER — Telehealth: Payer: Self-pay | Admitting: *Deleted

## 2018-08-27 ENCOUNTER — Telehealth: Payer: Self-pay | Admitting: Adult Health

## 2018-08-27 NOTE — Telephone Encounter (Signed)
I have not received anything yet but I will check with medical records and update accordingly

## 2018-08-27 NOTE — Telephone Encounter (Signed)
The Rebif form was signed.

## 2018-08-27 NOTE — Telephone Encounter (Signed)
I received form for financial assistance for REBIF.  Needing prescription.  Completed and to be signed by Dr. Anne Hahn.

## 2018-08-27 NOTE — Telephone Encounter (Signed)
Derek Collins from [employer] called wanting to know if the request for the pts REBIF REBIDOSE 44 MCG/0.5ML SOAJ was received. Please advise.

## 2018-08-28 NOTE — Telephone Encounter (Signed)
Faxed rebif [place] lifeline enrollment and prescription form to [phone].  (signed by Dr. Anne Hahn).

## 2018-09-29 ENCOUNTER — Ambulatory Visit (INDEPENDENT_AMBULATORY_CARE_PROVIDER_SITE_OTHER): Payer: Medicare Other | Admitting: Adult Health

## 2018-09-29 ENCOUNTER — Encounter: Payer: Self-pay | Admitting: Adult Health

## 2018-09-29 ENCOUNTER — Telehealth: Payer: Self-pay | Admitting: Adult Health

## 2018-09-29 ENCOUNTER — Other Ambulatory Visit: Payer: Self-pay

## 2018-09-29 VITALS — BP 137/79 | HR 93 | Temp 97.8°F | Ht 71.0 in | Wt 192.0 lb

## 2018-09-29 DIAGNOSIS — R269 Unspecified abnormalities of gait and mobility: Secondary | ICD-10-CM

## 2018-09-29 DIAGNOSIS — G35 Multiple sclerosis: Secondary | ICD-10-CM

## 2018-09-29 DIAGNOSIS — N39498 Other specified urinary incontinence: Secondary | ICD-10-CM

## 2018-09-29 NOTE — Patient Instructions (Addendum)
Your Plan:  Continue Rebif Blood work today MRI Brain and Cervical spine Referral to PT and urology If your symptoms worsen or you develop new symptoms please let us know.   Thank you for coming to see [place] at [facility]. I hope we have been able to provide you high quality care today.  You may receive a patient satisfaction survey over the next few weeks. We would appreciate your feedback and comments so that we may continue to improve ourselves and the health of our patients.

## 2018-09-29 NOTE — Progress Notes (Signed)
PATIENT: Derek CarinaRobert D Collins DOB: [birth year]  REASON FOR VISIT: follow up HISTORY FROM: patient  HISTORY OF PRESENT ILLNESS: Today 09/29/18:  Derek Collins is a 68 year old male with a history of multiple sclerosis.  He returns today for follow-up.  He is currently on Rebif and tolerating it well.  Although his daughter who is with him today reports that he does not take it consistently.  She also reports he does not take his insulin consistently.  He denies any new numbness or weakness.  He has always had more weakness on the right side.  He continues to have trouble with his gait and balance.  He does have a cane and walker that he will use.  Reports that he had a fall 2 weeks ago trying to get out of bed.  Fortunately no injuries.  The patient wears depends due urinary incontinence.  The patient and his daughter reports this is a big concern for the patient.  He has tried [employer] EnergyMyrbetriq and Ditropan.  He reports that Ditropan helps some but he tends to urinate through his depends.  For that reason he typically does not go out of the house much.  Denies any changes with his vision.  He joins me today for follow-up.  HISTORY 12/30/17:  Derek Collins is a 68 year old male with a history of multiple sclerosis.  He returns today for follow-up.  He is with his 2 daughters.  He continues on Requip.  His daughter reports that he had several falls in the last 2 to 3 months.  They feel that his walking has gotten worse.  He does use a cane or walker when ambulating.  They state that he has began to wear depends.  He reports that his urinary frequency and urgency has increased in the last 2 months.  The patient reports that he sleeps okay.  Reports good appetite.  He does feel depressed at times.  He also feels that his right side is weaker than it used to be.  He denies any new numbness or tingling.  He returns today for evaluation.  REVIEW OF SYSTEMS: Out of a complete 14 system review of symptoms, the patient complains  only of the following symptoms, and all other reviewed systems are negative.  See HPI  ALLERGIES: No Known Allergies  HOME MEDICATIONS: Outpatient Medications Prior to Visit  Medication Sig Dispense Refill  . DULoxetine (CYMBALTA) 30 MG capsule Take 30 mg by mouth daily.    . DULoxetine (CYMBALTA) 60 MG capsule Take 1 capsule (60 mg total) by mouth 2 (two) times daily. (Patient taking differently: Take 60 mg by mouth daily. ) 60 capsule 5  . gabapentin (NEURONTIN) 400 MG capsule Take 1 capsule (400 mg total) by mouth 3 (three) times daily. 90 capsule 11  . Insulin Glargine 300 UNIT/ML SOPN Inject 50 Units into the skin at bedtime.    . ONE TOUCH ULTRA TEST test strip 1 each by Other route 3 (three) times daily. -Contour    . oxybutynin (DITROPAN) 5 MG tablet TAKE 1 TABLET BY MOUTH THREE TIMES DAILY 270 tablet 1  . pravastatin (PRAVACHOL) 40 MG tablet Take 40 mg by mouth daily.    Marland Kitchen. REBIF REBIDOSE 44 MCG/0.5ML SOAJ Use as directed by physician. Give 44 mcg (0.5 ml) as a subcutaneous injection three times per week.   PROTECT FROM LIGHT (Patient taking differently: Inject 44 mcg into the muscle every Monday, Wednesday, and Friday. ) 36 Syringe 2  . repaglinide (PRANDIN)  2 MG tablet Take 2 mg by mouth 3 (three) times daily before meals.    . sitaGLIPtin (JANUVIA) 100 MG tablet Take 1 tablet (100 mg total) by mouth daily. (Patient taking differently: Take 100 mg by mouth at bedtime. )    . tamsulosin (FLOMAX) 0.4 MG CAPS capsule Take 0.4 mg by mouth daily.      No facility-administered medications prior to visit.     PAST MEDICAL HISTORY: Past Medical History:  Diagnosis Date  . Carpal tunnel syndrome, bilateral   . Chronic fatigue   . Chronic leg pain   . Chronic low back pain   . Depression   . Diabetes (HCC)   . Diabetic peripheral neuropathy (HCC)   . Gait disorder   . Macular degeneration of left eye   . Multiple sclerosis (HCC)   . Obesity   . Renal calculi   . Urinary  frequency     PAST SURGICAL HISTORY: Past Surgical History:  Procedure Laterality Date  . EYE SURGERY Right    cataract  . EYE SURGERY Right 07/2016   repair of previous surgery  . HERNIA REPAIR    . TONSILLECTOMY      FAMILY HISTORY: Family History  Problem Relation Age of Onset  . Stroke Father   . Kidney failure Mother   . High blood pressure Mother   . Cancer Sister        Breast cancer/Renal cell carcinoma    SOCIAL HISTORY: Social History   Socioeconomic History  . Marital status: Married    Spouse name: Not on file  . Number of children: 2  . Years of education: 1-College  . Highest education level: Not on file  Occupational History  . Occupation: retired    Associate Professormployer: OTHER    Comment: Geneticist, [employer]  Social Needs  . Financial resource strain: Not on file  . Food insecurity:    Worry: Not on file    Inability: Not on file  . Transportation needs:    Medical: Not on file    Non-medical: Not on file  Tobacco Use  . Smoking status: Former Smoker    Packs/day: 1.00    Years: 25.00    Pack years: 25.00    Types: Cigarettes  . Smokeless tobacco: Never Used  . Tobacco comment: Quit 30 years ago.  Substance and Sexual Activity  . Alcohol use: No    Alcohol/week: 0.0 standard drinks    Comment: Consumes beer on occasion  . Drug use: No  . Sexual activity: Not on file  Lifestyle  . Physical activity:    Days per week: Not on file    Minutes per session: Not on file  . Stress: Not on file  Relationships  . Social connections:    Talks on phone: Not on file    Gets together: Not on file    Attends religious service: Not on file    Active member of club or organization: Not on file    Attends meetings of clubs or organizations: Not on file    Relationship status: Not on file  . Intimate partner violence:    Fear of current or ex partner: Not on file    Emotionally abused: Not on file    Physically abused: Not on file    Forced sexual activity: Not on  file  Other Topics Concern  . Not on file  Social History Narrative   Patient lives at home with his wife Basil DessKatheryn.  Patient is disabled.   Education high school two years vocational.   Left handed.   Caffeine two soda's daily.      PHYSICAL EXAM  Vitals:   09/29/18 1257  Height: 5\' 11"  (1.803 m)   Body mass index is 26.57 kg/m.  Generalized: Well developed, in no acute distress   Neurological examination  Mentation: Alert oriented to time, place, history taking. Follows all commands speech and language fluent Cranial nerve II-XII: Pupils were equal round reactive to light. Extraocular movements were full, visual field were full on confrontational test. Facial sensation and strength were normal. Uvula tongue midline. Head turning and shoulder shrug  were normal and symmetric. Motor: The motor testing reveals 5 over 5 strength of all 4 extremities with the exception of right foot drop. Sensory: Sensory testing is intact to soft touch on all 4 extremities. No evidence of extinction is noted.  Coordination: Cerebellar testing reveals good finger-nose-finger and heel-to-shin bilaterally.  Gait and station: Patient's gait is unsteady.  He uses a walker when ambulating.  Circumduction type gait noted. \  DIAGNOSTIC DATA (LABS, IMAGING, TESTING) - I reviewed patient records, labs, notes, testing and imaging myself where available.  Lab Results  Component Value Date   WBC 3.9 (L) 03/15/2018   HGB 10.3 (L) 03/15/2018   HCT 33.1 (L) 03/15/2018   MCV 90.9 03/15/2018   PLT 273 03/15/2018      Component Value Date/Time   NA 137 03/15/2018 0921   NA 126 (L) 12/30/2017 1414   K 5.1 03/15/2018 0921   CL 111 03/15/2018 0921   CO2 20 (L) 03/15/2018 0921   GLUCOSE 207 (H) 03/15/2018 0921   BUN 31 (H) 03/15/2018 0921   BUN 22 12/30/2017 1414   CREATININE 2.34 (H) 03/15/2018 0921   CALCIUM 9.7 03/15/2018 0921   PROT 6.5 03/13/2018 1111   PROT 7.3 12/30/2017 1414   ALBUMIN 3.2  (L) 03/15/2018 0921   ALBUMIN 4.4 12/30/2017 1414   AST 26 03/13/2018 1111   ALT 20 03/13/2018 1111   ALKPHOS 79 03/13/2018 1111   BILITOT 0.8 03/13/2018 1111   BILITOT 0.7 12/30/2017 1414   GFRNONAA 27 (L) 03/15/2018 0921   GFRAA 32 (L) 03/15/2018 0921    Lab Results  Component Value Date   TSH 1.365 03/15/2018      ASSESSMENT AND PLAN 68 y.o. year old male  has a past medical history of Carpal tunnel syndrome, bilateral, Chronic fatigue, Chronic leg pain, Chronic low back pain, Depression, Diabetes ([place]), Diabetic peripheral neuropathy ([place]), Gait disorder, Macular degeneration of left eye, Multiple sclerosis ([place]), Obesity, Renal calculi, and Urinary frequency. here with:  1.  Multiple sclerosis 2.  Urinary incontinence 3.  Abnormality of gait    He will continue on Rebif.  I will check blood work today.  He will continue on Cymbalta.  The patient never had MRI of the brain or cervical spine after the last visit.  These will be reordered today.  I will also refer him to physical therapy for gait and balance training.  The patient will also be referred to urology for urinary incontinence to evaluate other treatment options.  I have advised that if his symptoms worsen or he develops new symptoms he should let us know.  He will follow-up in 6 months or sooner if needed.     Ward Givens, MSN, NP-C 09/29/2018, 1:05 PM [facility] [address], [place], [place] [address] [phone]

## 2018-09-29 NOTE — Telephone Encounter (Signed)
[employer] medicare order sent to [facility]. No auth they will reach out to the patient to schedule.  

## 2018-09-29 NOTE — Progress Notes (Signed)
I have read the note, and I agree with the clinical assessment and plan.  Derek Collins   

## 2018-09-30 ENCOUNTER — Telehealth: Payer: Self-pay | Admitting: *Deleted

## 2018-09-30 LAB — CBC WITH DIFFERENTIAL/PLATELET
Basophils Absolute: 0.1 10*3/uL (ref 0.0–0.2)
Basos: 1 %
EOS (ABSOLUTE): 0.1 10*3/uL (ref 0.0–0.4)
Eos: 1 %
Hematocrit: 40.4 % (ref 37.5–51.0)
Hemoglobin: 13.9 g/dL (ref 13.0–17.7)
Immature Grans (Abs): 0 10*3/uL (ref 0.0–0.1)
Immature Granulocytes: 0 %
Lymphocytes Absolute: 1.9 10*3/uL (ref 0.7–3.1)
Lymphs: 25 %
MCH: 29.9 pg (ref 26.6–33.0)
MCHC: 34.4 g/dL (ref 31.5–35.7)
MCV: 87 fL (ref 79–97)
Monocytes Absolute: 0.7 10*3/uL (ref 0.1–0.9)
Monocytes: 9 %
Neutrophils Absolute: 4.8 10*3/uL (ref 1.4–7.0)
Neutrophils: 64 %
Platelets: 300 10*3/uL (ref 150–450)
RBC: 4.65 x10E6/uL (ref 4.14–5.80)
RDW: 12.9 % (ref 11.6–15.4)
WBC: 7.5 10*3/uL (ref 3.4–10.8)

## 2018-09-30 LAB — COMPREHENSIVE METABOLIC PANEL
ALT: 15 IU/L (ref 0–44)
AST: 15 IU/L (ref 0–40)
Albumin/Globulin Ratio: 2 (ref 1.2–2.2)
Albumin: 4.5 g/dL (ref 3.8–4.8)
Alkaline Phosphatase: 110 IU/L (ref 39–117)
BUN/Creatinine Ratio: 20 (ref 10–24)
BUN: 24 mg/dL (ref 8–27)
Bilirubin Total: 0.4 mg/dL (ref 0.0–1.2)
CO2: 21 mmol/L (ref 20–29)
Calcium: 11.1 mg/dL — ABNORMAL HIGH (ref 8.6–10.2)
Chloride: 97 mmol/L (ref 96–106)
Creatinine, Ser: 1.18 mg/dL (ref 0.76–1.27)
GFR calc Af Amer: 73 mL/min/{1.73_m2} (ref >59)
GFR calc non Af Amer: 63 mL/min/{1.73_m2} (ref >59)
Globulin, Total: 2.2 g/dL (ref 1.5–4.5)
Glucose: 375 mg/dL — ABNORMAL HIGH (ref 65–99)
Potassium: 4.2 mmol/L (ref 3.5–5.2)
Sodium: 133 mmol/L — ABNORMAL LOW (ref 134–144)
Total Protein: 6.7 g/dL (ref 6.0–8.5)

## 2018-09-30 NOTE — Telephone Encounter (Signed)
Relayed to Mrs. Wildasin lab results per above.  She was aware of glucose levels being elevated.  She will relay to pt.

## 2018-09-30 NOTE — Telephone Encounter (Signed)
-----   Message from Ward Givens, NP sent at 09/30/2018  9:16 AM EDT ----- glusoce is elevated.  Patient states in the office visit that his PCP is aware of his elevated glucose.  Patient has also not been taking insulin consistently.  Please forward these results to his PCP.

## 2018-10-08 ENCOUNTER — Telehealth: Payer: Self-pay

## 2018-10-08 NOTE — Telephone Encounter (Signed)
PT/OT evaluation papers have been signed and faxed back to [facility] at [phone]. Confirmation fax has been received.

## 2018-10-23 ENCOUNTER — Ambulatory Visit
Admission: RE | Admit: 2018-10-23 | Discharge: 2018-10-23 | Disposition: A | Discharge status: Home / Self Care | Payer: Medicare Other | Source: Ambulatory Visit | Attending: Adult Health | Admitting: Adult Health

## 2018-10-23 ENCOUNTER — Other Ambulatory Visit: Payer: Self-pay

## 2018-10-23 DIAGNOSIS — G35 Multiple sclerosis: Secondary | ICD-10-CM

## 2018-10-23 MED ORDER — GADOBENATE DIMEGLUMINE 529 MG/ML IV SOLN
18.0000 mL | Freq: Once | INTRAVENOUS | Status: AC | PRN
  Administered 2018-10-23: 18 mL via INTRAVENOUS

## 2018-10-28 ENCOUNTER — Telehealth: Payer: Self-pay | Admitting: *Deleted

## 2018-10-28 NOTE — Telephone Encounter (Signed)
-----   Message from Ward Givens, NP sent at 10/27/2018  3:40 PM EDT ----- MRI of the cervical spine is stable compared to previous study.

## 2018-10-28 NOTE — Telephone Encounter (Signed)
I called pt and LMVM that MRI brain (No change from previous MRI in 2018), and MRI cervical stable compared to previous study.  Pt is to call back if questions.

## 2018-12-03 ENCOUNTER — Telehealth: Payer: Self-pay | Admitting: *Deleted

## 2018-12-03 NOTE — Telephone Encounter (Signed)
Received diagnosis verification form from [employer] co-pay relief program. Patient's ID# [id number]. Form filled out,  on M Millikan, NP's desk for review and signature.

## 2018-12-03 NOTE — Telephone Encounter (Signed)
Form signed by NP, sent to medical records for processing.

## 2018-12-04 ENCOUNTER — Telehealth: Payer: Self-pay | Admitting: *Deleted

## 2018-12-04 NOTE — Telephone Encounter (Signed)
I faxed  patient advocate form on 12/04/[phone]

## 2018-12-04 NOTE — Telephone Encounter (Signed)
Received fax for signature re: PT for pt.  Strang.  (3rd request).

## 2018-12-11 NOTE — Telephone Encounter (Signed)
Received letter from pt relating PAP:  need confirmation of diagnosis and treatment.  I called [phone] spoke to Janett Billow about this information.  They received 12-04-18 this information by fax.  Nothing else needed.

## 2018-12-11 NOTE — Telephone Encounter (Signed)
Fax confirmation received [phone],  [phone]. [facility], oupt PT.

## 2018-12-24 ENCOUNTER — Other Ambulatory Visit: Payer: Self-pay | Admitting: Adult Health

## 2019-03-31 ENCOUNTER — Ambulatory Visit: Payer: Medicare Other | Admitting: Adult Health

## 2019-05-03 ENCOUNTER — Other Ambulatory Visit: Payer: Self-pay | Admitting: Adult Health

## 2019-08-09 ENCOUNTER — Other Ambulatory Visit: Payer: Self-pay | Admitting: Adult Health

## 2019-08-11 NOTE — Telephone Encounter (Signed)
Spoke to wife of pt and pt did go to urology consult.  He is still taking the ditropan, (she stated not really helping that much, but needs refill.

## 2019-08-12 IMAGING — US US RENAL
1 series · 14 of 25 positions shown · non-contrast
Comparison: CT abdomen and pelvis 10/07/2008

CLINICAL DATA: Acute renal failure.

EXAM:
RENAL / URINARY TRACT ULTRASOUND COMPLETE

[Series 1: us renal · 0.25mm/px · 33 acquisitions, 14 frames shown]
[im 1/33]
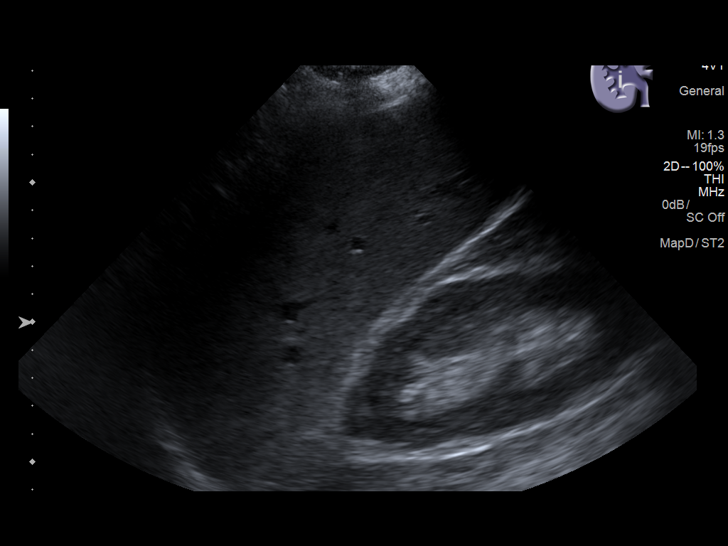
[im 3/33]
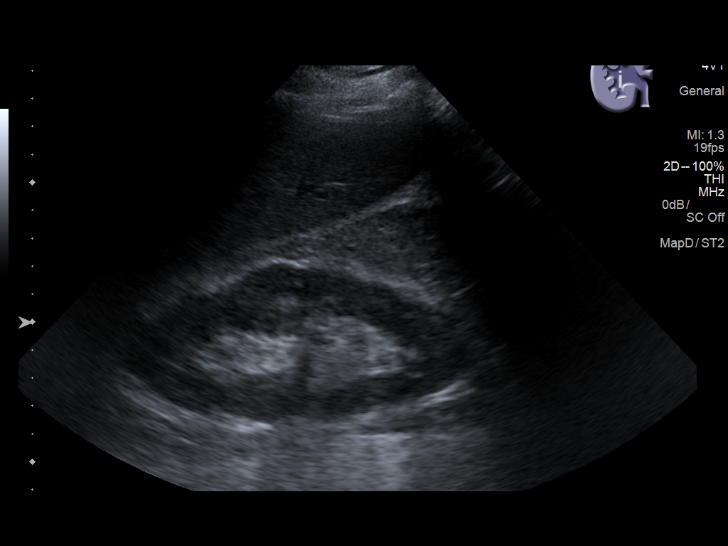
[im 6/33]
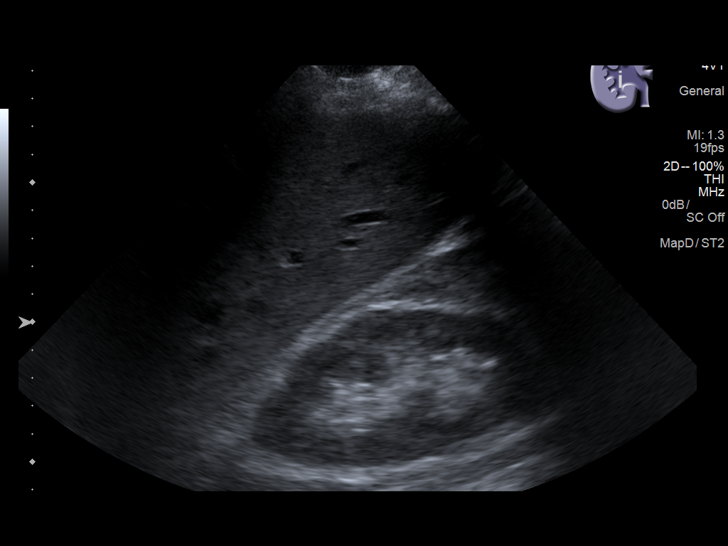
[im 9/33]
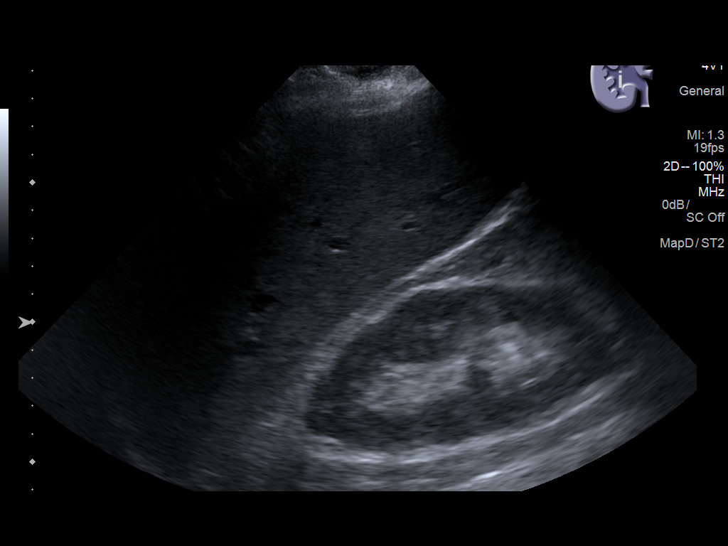
[im 11/33]
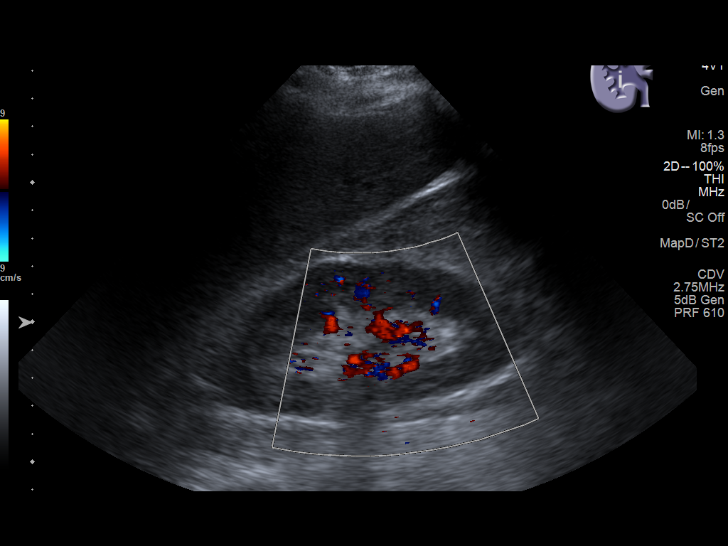
[im 13/33]
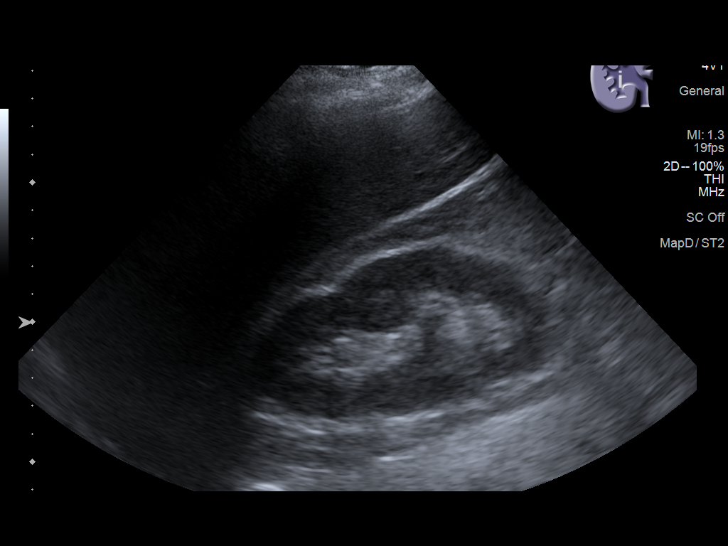
[im 15/33]
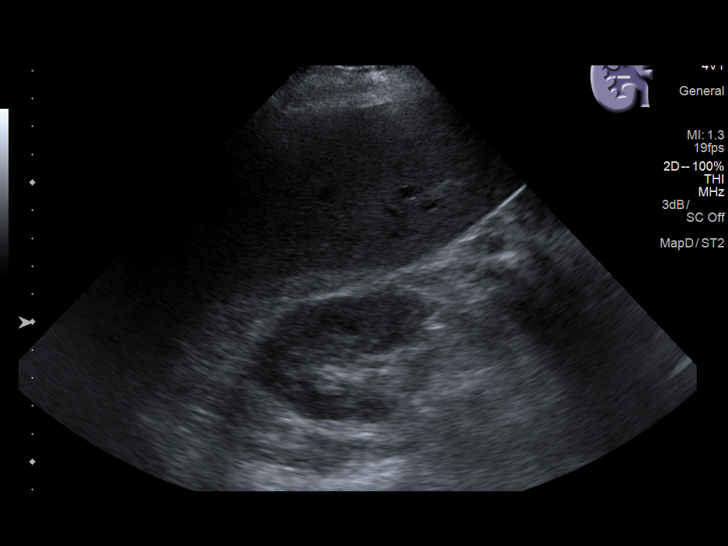
[im 18/33]
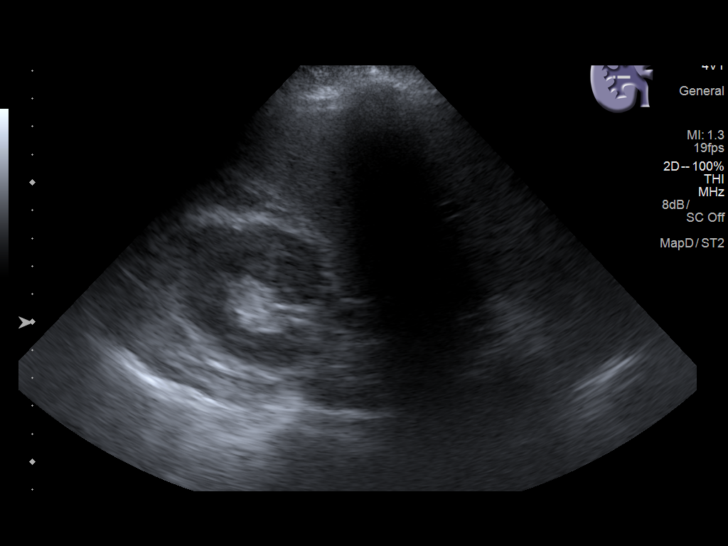
[im 21/33]
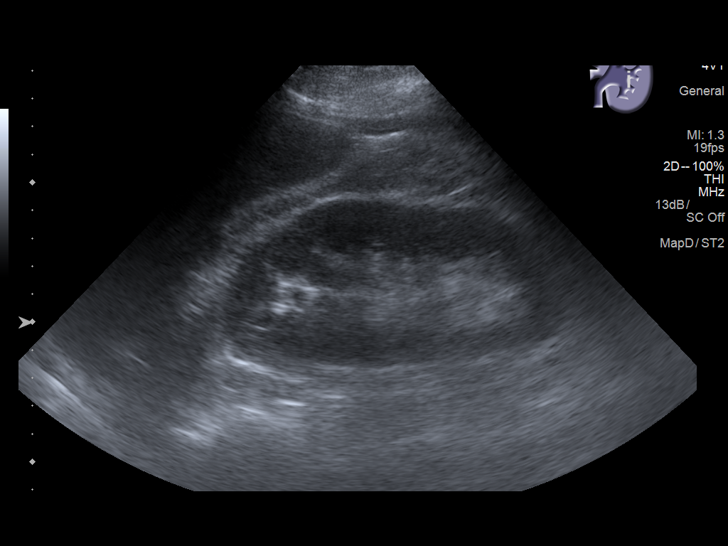
[im 22/33]
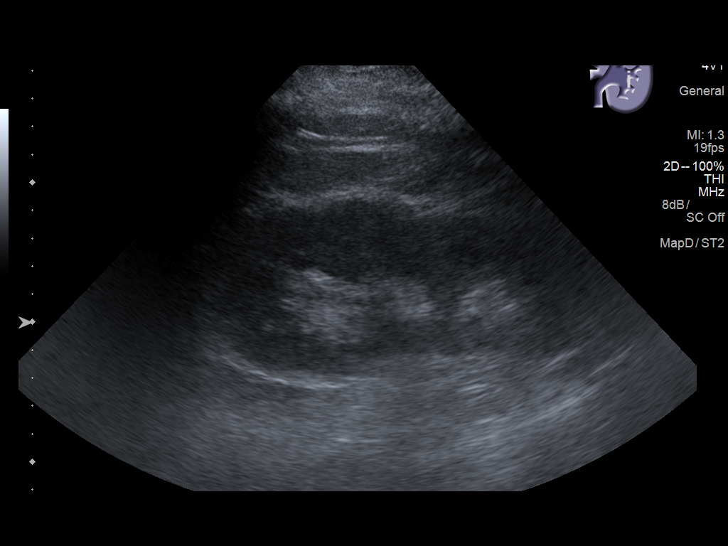
[im 25/33]
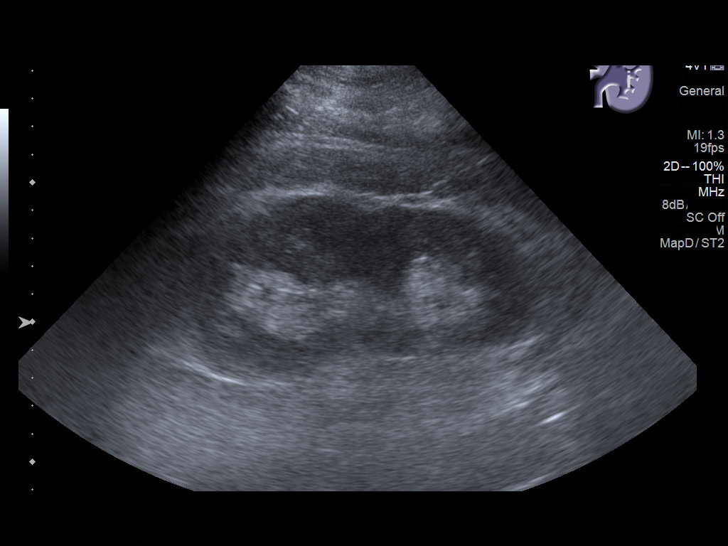
[im 27/33]
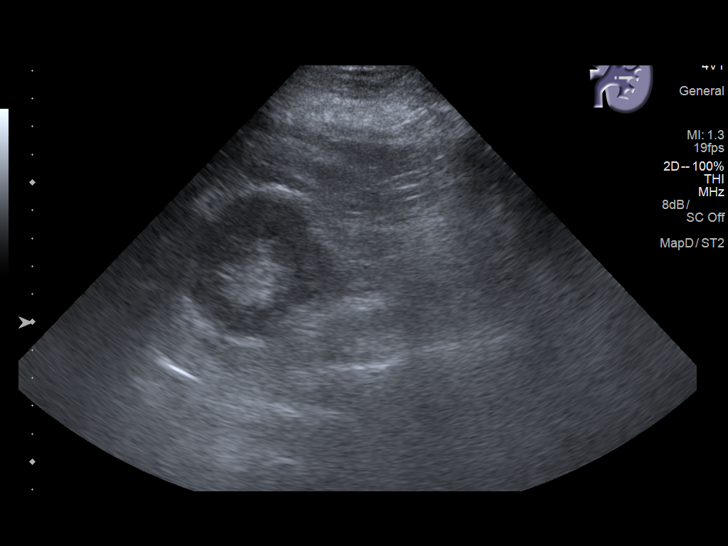
[im 30/33]
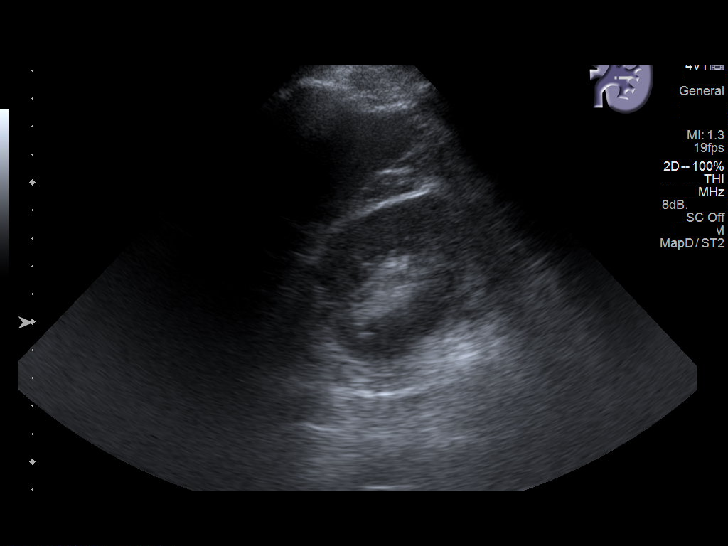
[im 33/33]
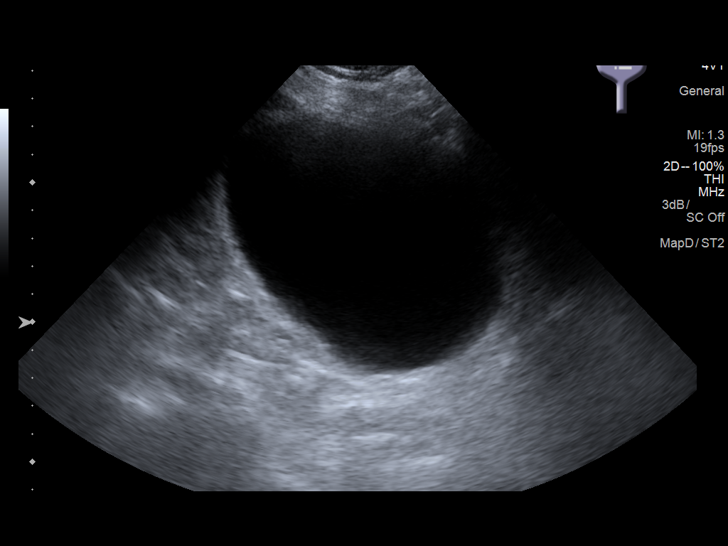

[14 of 25 positions shown; findings below may reference images not displayed]

FINDINGS: Right Kidney:

Renal measurements: 12.2 x 5.8 x 5.9 cm = volume: 220 mL .
Echogenicity within normal limits. No mass or hydronephrosis
visualized.

Left Kidney:

Renal measurements: 12.9 x 5.6 x 5.5 cm = volume: 213 mL.
Echogenicity within normal limits. No mass or hydronephrosis
visualized.

Bladder:

Appears normal for degree of bladder distention.
IMPRESSION: Normal ultrasound appearance of the kidneys.  No hydronephrosis.

## 2019-09-23 ENCOUNTER — Telehealth: Payer: Self-pay | Admitting: *Deleted

## 2019-09-23 NOTE — Telephone Encounter (Signed)
I called wife of pt and she stated he does not want to take rebif, as he is already on too many other injectibles.  (feesl like a pin cushion).  He is stubborn she says,  He hides it from her.  I made appt with NP as Dr. Anne Hahn had no other slots sooner.  10-27-19 at 1300. Wife fine with this.

## 2019-10-27 ENCOUNTER — Encounter: Payer: Self-pay | Admitting: Adult Health

## 2019-10-27 ENCOUNTER — Ambulatory Visit: Payer: Medicare Other | Admitting: Adult Health

## 2019-10-27 ENCOUNTER — Other Ambulatory Visit: Payer: Self-pay

## 2019-10-27 VITALS — BP 107/69 | HR 112 | Ht 71.0 in | Wt 190.0 lb

## 2019-10-27 DIAGNOSIS — N39498 Other specified urinary incontinence: Secondary | ICD-10-CM

## 2019-10-27 DIAGNOSIS — G35 Multiple sclerosis: Secondary | ICD-10-CM | POA: Diagnosis not present

## 2019-10-27 DIAGNOSIS — R269 Unspecified abnormalities of gait and mobility: Secondary | ICD-10-CM

## 2019-10-27 DIAGNOSIS — F329 Major depressive disorder, single episode, unspecified: Secondary | ICD-10-CM

## 2019-10-27 DIAGNOSIS — F32A Depression, unspecified: Secondary | ICD-10-CM

## 2019-10-27 NOTE — Progress Notes (Signed)
PATIENT: Derek Collins DOB: Jul 15, 1950  REASON FOR VISIT: follow up HISTORY FROM: patient  HISTORY OF PRESENT ILLNESS: Today 10/27/19:  Derek Collins is a 69 year old male with a history of multiple sclerosis.  He returns today for follow-up.  He reports that he is no longer taking Rebif.  Reports that he was not taking it consistently before but decided he no longer wanted to be on this medication.  He continues to have the most trouble out of the right leg.  Reports that his gait is very slow but fortunately has not had any recent falls.  He does use a walker when ambulating.  Denies any new numbness or weakness.  No changes with his vision.  Continues to have incontinence of bladder.  He does see a urologist.  He has participated in physical therapy in the past reports that he did not find it beneficial.  HISTORY 09/29/18:  Derek Collins is a 69 year old male with a history of multiple sclerosis.  He returns today for follow-up.  He is currently on Rebif and tolerating it well.  Although his daughter who is with him today reports that he does not take it consistently.  She also reports he does not take his insulin consistently.  He denies any new numbness or weakness.  He has always had more weakness on the right side.  He continues to have trouble with his gait and balance.  He does have a cane and walker that he will use.  Reports that he had a fall 2 weeks ago trying to get out of bed.  Fortunately no injuries.  The patient wears depends due urinary incontinence.  The patient and his daughter reports this is a big concern for the patient.  He has tried [employer] and Ditropan.  He reports that Ditropan helps some but he tends to urinate through his depends.  For that reason he typically does not go out of the house much.  Denies any changes with his vision.  He joins me today for follow-up.  REVIEW OF SYSTEMS: Out of a complete 14 system review of symptoms, the patient complains only of the following  symptoms, and all other reviewed systems are negative.  See HPI  ALLERGIES: No Known Allergies  HOME MEDICATIONS: Outpatient Medications Prior to Visit  Medication Sig Dispense Refill  . DULoxetine (CYMBALTA) 30 MG capsule Take 30 mg by mouth daily.    . DULoxetine (CYMBALTA) 60 MG capsule Take 1 capsule by mouth twice daily 60 capsule 5  . Insulin Glargine 300 UNIT/ML SOPN Inject 50 Units into the skin at bedtime.    . ONE TOUCH ULTRA TEST test strip 1 each by Other route 3 (three) times daily. -Contour    . oxybutynin (DITROPAN) 5 MG tablet TAKE 1 TABLET BY MOUTH THREE TIMES DAILY 270 tablet 0  . OZEMPIC, 0.25 OR 0.5 MG/DOSE, 2 MG/1.5ML SOPN SMARTSIG:0.75 Milliliter(s) SUB-Q Once a Week    . pravastatin (PRAVACHOL) 40 MG tablet Take 40 mg by mouth daily.    . tamsulosin (FLOMAX) 0.4 MG CAPS capsule Take 0.4 mg by mouth daily.     Marland Kitchen gabapentin (NEURONTIN) 400 MG capsule Take 1 capsule (400 mg total) by mouth 3 (three) times daily. (Patient not taking: Reported on 09/29/2018) 90 capsule 11  . REBIF REBIDOSE 44 MCG/0.5ML SOAJ Use as directed by physician. Give 44 mcg (0.5 ml) as a subcutaneous injection three times per week.   PROTECT FROM LIGHT 18 mL 3  .  repaglinide (PRANDIN) 2 MG tablet Take 2 mg by mouth 3 (three) times daily before meals.    . sitaGLIPtin (JANUVIA) 100 MG tablet Take 1 tablet (100 mg total) by mouth daily. (Patient taking differently: Take 100 mg by mouth at bedtime. )     No facility-administered medications prior to visit.    PAST MEDICAL HISTORY: Past Medical History:  Diagnosis Date  . Carpal tunnel syndrome, bilateral   . Chronic fatigue   . Chronic leg pain   . Chronic low back pain   . Depression   . Diabetes (HCC)   . Diabetic peripheral neuropathy (HCC)   . Gait disorder   . Macular degeneration of left eye   . Multiple sclerosis (HCC)   . Obesity   . Renal calculi   . Urinary frequency     PAST SURGICAL HISTORY: Past Surgical History:   Procedure Laterality Date  . EYE SURGERY Right    cataract  . EYE SURGERY Right 07/2016   repair of previous surgery  . HERNIA REPAIR    . TONSILLECTOMY      FAMILY HISTORY: Family History  Problem Relation Age of Onset  . Stroke Father   . Kidney failure Mother   . High blood pressure Mother   . Cancer Sister        Breast cancer/Renal cell carcinoma    SOCIAL HISTORY: Social History   Socioeconomic History  . Marital status: Married    Spouse name: Not on file  . Number of children: 2  . Years of education: 1-College  . Highest education level: Not on file  Occupational History  . Occupation: retired    Associate Professor: OTHER    Comment: [employer]  Tobacco Use  . Smoking status: Former Smoker    Packs/day: 1.00    Years: 25.00    Pack years: 25.00    Types: Cigarettes  . Smokeless tobacco: Never Used  . Tobacco comment: Quit 30 years ago.  Vaping Use  . Vaping Use: Never used  Substance and Sexual Activity  . Alcohol use: No    Alcohol/week: 0.0 standard drinks    Comment: Consumes beer on occasion  . Drug use: No  . Sexual activity: Not on file  Other Topics Concern  . Not on file  Social History Narrative   Patient lives at home with his wife Derek Collins.    Patient is disabled.   Education high school two years vocational.   Left handed.   Caffeine two soda's daily.   Social Determinants of Health   Financial Resource Strain:   . Difficulty of Paying Living Expenses:   Food Insecurity:   . Worried About Programme researcher, broadcasting/film/video in the Last Year:   . Barista in the Last Year:   Transportation Needs:   . Freight forwarder (Medical):   Marland Kitchen Lack of Transportation (Non-Medical):   Physical Activity:   . Days of Exercise per Week:   . Minutes of Exercise per Session:   Stress:   . Feeling of Stress :   Social Connections:   . Frequency of Communication with Friends and Family:   . Frequency of Social Gatherings with Friends and Family:   .  Attends Religious Services:   . Active Member of Clubs or Organizations:   . Attends Banker Meetings:   Marland Kitchen Marital Status:   Intimate Partner Violence:   . Fear of Current or Ex-Partner:   . Emotionally Abused:   .  Physically Abused:   . Sexually Abused:       PHYSICAL EXAM  Vitals:   10/27/19 1302  Weight: 190 lb (86.2 kg)  Height: 5\' 11"  (1.803 m)   Body mass index is 26.5 kg/m.  Generalized: Well developed, in no acute distress   Neurological examination  Mentation: Alert oriented to time, place, history taking. Follows all commands speech and language fluent Cranial nerve II-XII: Pupils were equal round reactive to light. Extraocular movements were full, visual field were full on confrontational test.. Head turning and shoulder shrug  were normal and symmetric. Motor: The motor testing reveals 5 over 5 strength of all 4 extremities. Good symmetric motor tone is noted throughout.  Right foot drop noted. Sensory: Sensory testing is intact to soft touch on all 4 extremities. No evidence of extinction is noted.  Coordination: Cerebellar testing reveals good finger-nose-finger bilaterally.  Unable to complete heel-to-shin due to mobility Gait and station: Patient has a circumduction type gait on the right.  Gait is slow and cautious.  Tandem gait not attempted.   DIAGNOSTIC DATA (LABS, IMAGING, TESTING) - I reviewed patient records, labs, notes, testing and imaging myself where available.  Lab Results  Component Value Date   WBC 7.5 09/29/2018   HGB 13.9 09/29/2018   HCT 40.4 09/29/2018   MCV 87 09/29/2018   PLT 300 09/29/2018      Component Value Date/Time   NA 133 (L) 09/29/2018 1340   K 4.2 09/29/2018 1340   CL 97 09/29/2018 1340   CO2 21 09/29/2018 1340   GLUCOSE 375 (H) 09/29/2018 1340   GLUCOSE 207 (H) 03/15/2018 0921   BUN 24 09/29/2018 1340   CREATININE 1.18 09/29/2018 1340   CALCIUM 11.1 (H) 09/29/2018 1340   PROT 6.7 09/29/2018 1340    ALBUMIN 4.5 09/29/2018 1340   AST 15 09/29/2018 1340   ALT 15 09/29/2018 1340   ALKPHOS 110 09/29/2018 1340   BILITOT 0.4 09/29/2018 1340   GFRNONAA 63 09/29/2018 1340   GFRAA 73 09/29/2018 1340   No results found for: CHOL, HDL, LDLCALC, LDLDIRECT, TRIG, CHOLHDL Lab Results  Component Value Date   HGBA1C 9.6 (H) 03/13/2018   No results found for: VITAMINB12 Lab Results  Component Value Date   TSH 1.365 03/15/2018      ASSESSMENT AND PLAN 69 y.o. year old male  has a past medical history of Carpal tunnel syndrome, bilateral, Chronic fatigue, Chronic leg pain, Chronic low back pain, Depression, Diabetes (HCC), Diabetic peripheral neuropathy (HCC), Gait disorder, Macular degeneration of left eye, Multiple sclerosis (HCC), Obesity, Renal calculi, and Urinary frequency. here with:  Multiple sclerosis   MRI has been stable  We will consult Dr. 73 regarding the patient being off medication  Urinary incontinence    Continue oxybutynin  Abnormality of gait   Suggested an AFO brace patient deferred for now  Deferred on physical therapy  Getting a wheelchair ordered by PCP  Depression   Continue Cymbalta   Advised if symptoms worsen or he develops new symptoms he should let Anne Hahn know follow-up in 6 months or sooner if needed  I spent 30 minutes of face-to-face and non-face-to-face time with patient.  This included previsit chart review, lab review, study review, order entry, electronic health record documentation, patient education.  [place], MSN, NP-C 10/27/2019, 1:05 PM [facility] [address] [place], [place] [phone]

## 2019-10-27 NOTE — Patient Instructions (Addendum)
Your Plan:  Continue Cymbalta and oxybutynin  If your symptoms worsen or you develop new symptoms please let us know.   Thank you for coming to see [place] at [facility]. I hope we have been able to provide you high quality care today.  You may receive a patient satisfaction survey over the next few weeks. We would appreciate your feedback and comments so that we may continue to improve ourselves and the health of our patients.

## 2019-10-27 NOTE — Progress Notes (Signed)
I have read the note, and I agree with the clinical assessment and plan.  Neliah Cuyler K Correne Lalani   

## 2019-11-12 ENCOUNTER — Telehealth: Payer: Self-pay | Admitting: *Deleted

## 2019-11-12 NOTE — Telephone Encounter (Signed)
Received fax from [employer] re: Rebif Rebidose Inj 44 mcg/0.5 mL requires re-authorization. [employer] will submit re-auth .  I submitted last office note and last lab report to f [phone].

## 2019-11-18 NOTE — Telephone Encounter (Addendum)
Called  [employer] to check on status of Rebif PA, spoke with rep who stated PA was submitted on 11/13/19, approved today thru 04/22/20. Thanked him for his help.

## 2020-01-04 ENCOUNTER — Encounter (INDEPENDENT_AMBULATORY_CARE_PROVIDER_SITE_OTHER): Payer: Medicare Other | Admitting: Ophthalmology

## 2020-01-06 ENCOUNTER — Encounter (INDEPENDENT_AMBULATORY_CARE_PROVIDER_SITE_OTHER): Payer: Medicare Other | Admitting: Ophthalmology

## 2020-01-28 ENCOUNTER — Encounter (INDEPENDENT_AMBULATORY_CARE_PROVIDER_SITE_OTHER): Payer: Self-pay | Admitting: Ophthalmology

## 2020-01-28 ENCOUNTER — Ambulatory Visit (INDEPENDENT_AMBULATORY_CARE_PROVIDER_SITE_OTHER): Payer: Medicare Other | Admitting: Ophthalmology

## 2020-01-28 ENCOUNTER — Other Ambulatory Visit: Payer: Self-pay

## 2020-01-28 DIAGNOSIS — H35372 Puckering of macula, left eye: Secondary | ICD-10-CM

## 2020-01-28 DIAGNOSIS — H35351 Cystoid macular degeneration, right eye: Secondary | ICD-10-CM

## 2020-01-28 DIAGNOSIS — H35371 Puckering of macula, right eye: Secondary | ICD-10-CM | POA: Diagnosis not present

## 2020-01-28 DIAGNOSIS — H353132 Nonexudative age-related macular degeneration, bilateral, intermediate dry stage: Secondary | ICD-10-CM | POA: Diagnosis not present

## 2020-01-28 DIAGNOSIS — E119 Type 2 diabetes mellitus without complications: Secondary | ICD-10-CM

## 2020-01-28 HISTORY — DX: Cystoid macular degeneration, right eye: H35.351

## 2020-01-28 NOTE — Assessment & Plan Note (Signed)
Clinical drusen.

## 2020-01-28 NOTE — Assessment & Plan Note (Signed)
Clinically evident but no topographic distortion thus minor observe only

## 2020-01-28 NOTE — Assessment & Plan Note (Signed)

## 2020-01-28 NOTE — Progress Notes (Signed)
01/28/2020     CHIEF COMPLAINT Patient presents for Retina Follow Up   HISTORY OF PRESENT ILLNESS: Derek Collins is a 69 y.o. male who presents to the clinic today for:   HPI    Retina Follow Up    Patient presents with  Diabetic Retinopathy.  In both eyes.  This started 1 year ago.  Severity is mild.  Duration of 1 year.  Since onset it is stable.          Comments    1 Year Diabetic/AMD F/U OU  Pt denies noticeable changes to [place] OU since last visit. Pt denies ocular pain, flashes of light, or floaters OU.  A1c: pt does not recall LBS: 134 this AM        Last edited by Ileana Roup, COA on 01/28/2020  2:02 PM. (History)      Referring physician: Joette Catching, MD [address] [place],  [place] [address]  HISTORICAL INFORMATION:   Selected notes from the MEDICAL RECORD NUMBER    Lab Results  Component Value Date   HGBA1C 9.6 (H) 03/13/2018     CURRENT MEDICATIONS: No current outpatient medications on file. (Ophthalmic Drugs)   No current facility-administered medications for this visit. (Ophthalmic Drugs)   Current Outpatient Medications (Other)  Medication Sig  . DULoxetine (CYMBALTA) 30 MG capsule Take 30 mg by mouth daily.  . DULoxetine (CYMBALTA) 60 MG capsule Take 1 capsule by mouth twice daily  . Insulin Glargine 300 UNIT/ML SOPN Inject 50 Units into the skin at bedtime.  . ONE TOUCH ULTRA TEST test strip 1 each by Other route 3 (three) times daily. -Contour  . oxybutynin (DITROPAN) 5 MG tablet TAKE 1 TABLET BY MOUTH THREE TIMES DAILY  . OZEMPIC, 0.25 OR 0.5 MG/DOSE, 2 MG/1.5ML SOPN SMARTSIG:0.75 Milliliter(s) SUB-Q Once a Week  . pravastatin (PRAVACHOL) 40 MG tablet Take 40 mg by mouth daily.  . tamsulosin (FLOMAX) 0.4 MG CAPS capsule Take 0.4 mg by mouth daily.    No current facility-administered medications for this visit. (Other)      REVIEW OF SYSTEMS:    ALLERGIES No Known Allergies  PAST MEDICAL HISTORY Past Medical History:    Diagnosis Date  . Carpal tunnel syndrome, bilateral   . Chronic fatigue   . Chronic leg pain   . Chronic low back pain   . Cystoid macular edema of right eye 01/28/2020  . Depression   . Diabetes (HCC)   . Diabetic peripheral neuropathy (HCC)   . Gait disorder   . Macular degeneration of left eye   . Multiple sclerosis (HCC)   . Obesity   . Renal calculi   . Urinary frequency    Past Surgical History:  Procedure Laterality Date  . EYE SURGERY Right    cataract  . EYE SURGERY Right 07/2016   repair of previous surgery  . HERNIA REPAIR    . TONSILLECTOMY      FAMILY HISTORY Family History  Problem Relation Age of Onset  . Stroke Father   . Kidney failure Mother   . High blood pressure Mother   . Cancer Sister        Breast cancer/Renal cell carcinoma    SOCIAL HISTORY Social History   Tobacco Use  . Smoking status: Former Smoker    Packs/day: 1.00    Years: 25.00    Pack years: 25.00    Types: Cigarettes  . Smokeless tobacco: Never Used  . Tobacco comment: Quit  30 years ago.  Vaping Use  . Vaping Use: Never used  Substance Use Topics  . Alcohol use: No    Alcohol/week: 0.0 standard drinks    Comment: Consumes beer on occasion  . Drug use: No         OPHTHALMIC EXAM:  Base Eye Exam    Visual Acuity (ETDRS)      Right Left   Dist cc 20/30 +2 20/30 +1   Dist ph cc NI NI   Correction: Glasses       Tonometry (Tonopen, 1:57 PM)      Right Left   Pressure 11 11       Pupils      Pupils Dark Light Shape React APD   Right PERRL 4 3 Round Brisk None   Left PERRL 4 3 Round Brisk None       Visual Fields (Counting fingers)      Left Right    Full Full       Extraocular Movement      Right Left    Full Full       Neuro/Psych    Oriented x3: Yes   Mood/Affect: Normal       Dilation    Both eyes: 1.0% Mydriacyl, 2.5% Phenylephrine @ 2:06 PM        Slit Lamp and Fundus Exam    External Exam      Right Left   External Normal Normal        Slit Lamp Exam      Right Left   Lids/Lashes Normal Normal   Conjunctiva/Sclera White and quiet White and quiet   Cornea Clear Clear   Anterior Chamber Deep and quiet Deep and quiet   Iris Round and reactive Round and reactive   Lens Centered posterior chamber intraocular lens, Open posterior capsule 2+ Nuclear sclerosis   Anterior Vitreous Clear Clear       Fundus Exam      Right Left   Posterior Vitreous Clear, vitrectomized Posterior vitreous detachment   Disc Normal Normal   C/D Ratio 0.0 0.0   Macula Topographic distortion superior to the fovea., Hard drusen Hard drusen   Vessels Normal, , no DR Normal, , no DR   Periphery Normal Normal          IMAGING AND PROCEDURES  Imaging and Procedures for 01/28/20  OCT, Retina - OU - Both Eyes       Right Eye Quality was good. Scan locations included subfoveal. Central Foveal Thickness: 427. Findings include epiretinal membrane, retinal drusen , no SRF, no IRF.   Left Eye Quality was good. Central Foveal Thickness: 325. Progression has been stable. Findings include retinal drusen .   Notes No active CNVM OD, subfoveal tortuosity from epiretinal membrane largely superior to the fovea OD. OS no active maculopathy                 ASSESSMENT/PLAN:  Left epiretinal membrane Clinically evident but no topographic distortion thus minor observe only  Intermediate stage nonexudative age-related macular degeneration of both eyes Clinical drusen.  Diabetes mellitus type 2 in nonobese [facility]) The patient has diabetes without any evidence of retinopathy. The patient advised to maintain good blood glucose control, excellent blood pressure control, and favorable levels of cholesterol, low density lipoprotein, and high density lipoproteins. Follow up in 1 year was recommended. Explained that fluctuations in visual acuity , or "out of focus", may result from large variations of blood sugar  control.      ICD-10-CM   1.  Right epiretinal membrane  H35.371   2. Left epiretinal membrane  H35.372   3. Intermediate stage nonexudative age-related macular degeneration of both eyes  H35.3132 OCT, Retina - OU - Both Eyes  4. Diabetes mellitus type 2 in nonobese (HCC)  E11.9     1. Stable, OD status post vitrectomy and repositioning and recent duration of posterior chamber intraocular lens, 2018.  2. Minor epiretinal membrane, nonfoveal involved will observe, OD  3. No detectable diabetic retinopathy  4. Patient to return to see Dr. Sallye Lat   Ophthalmic Meds Ordered this visit:  No orders of the defined types were placed in this encounter.      Return in about 2 years (around 01/27/2022) for DILATE OU, OCT.  There are no Patient Instructions on file for this visit.   Explained the diagnoses, plan, and follow up with the patient and they expressed understanding.  Patient expressed understanding of the importance of proper follow up care.   Alford Highland Jiana Lemaire M.D. Diseases & Surgery of the Retina and Vitreous [facility] 01/28/20     Abbreviations: M myopia (nearsighted); A astigmatism; H hyperopia (farsighted); P presbyopia; Mrx spectacle prescription;  CTL contact lenses; OD right eye; OS left eye; OU both eyes  XT exotropia; ET esotropia; PEK punctate epithelial keratitis; PEE punctate epithelial erosions; DES dry eye syndrome; MGD meibomian gland dysfunction; ATs artificial tears; PFAT's preservative free artificial tears; NSC nuclear sclerotic cataract; PSC posterior subcapsular cataract; ERM epi-retinal membrane; PVD posterior vitreous detachment; RD retinal detachment; DM diabetes mellitus; DR diabetic retinopathy; NPDR non-proliferative diabetic retinopathy; PDR proliferative diabetic retinopathy; CSME clinically significant macular edema; DME diabetic macular edema; dbh dot blot hemorrhages; CWS cotton wool spot; POAG primary open angle glaucoma; C/D cup-to-disc ratio; HVF  humphrey visual field; GVF goldmann visual field; OCT optical coherence tomography; IOP intraocular pressure; BRVO Branch retinal vein occlusion; CRVO central retinal vein occlusion; CRAO central retinal artery occlusion; BRAO branch retinal artery occlusion; RT retinal tear; SB scleral buckle; PPV pars plana vitrectomy; VH Vitreous hemorrhage; PRP panretinal laser photocoagulation; IVK intravitreal kenalog; VMT vitreomacular traction; MH Macular hole;  NVD neovascularization of the disc; NVE neovascularization elsewhere; AREDS age related eye disease study; ARMD age related macular degeneration; POAG primary open angle glaucoma; EBMD epithelial/anterior basement membrane dystrophy; ACIOL anterior chamber intraocular lens; IOL intraocular lens; PCIOL posterior chamber intraocular lens; Phaco/IOL phacoemulsification with intraocular lens placement; PRK photorefractive keratectomy; LASIK laser assisted in situ keratomileusis; HTN hypertension; DM diabetes mellitus; COPD chronic obstructive pulmonary disease

## 2020-04-25 IMAGING — CR DG CHEST 2V
2 series · 2 of 2 positions shown · non-contrast
Comparison: CT abdomen 07/23/2008

CLINICAL DATA: Malaise and fatigue

EXAM:
CHEST - 2 VIEW

[chest lat]
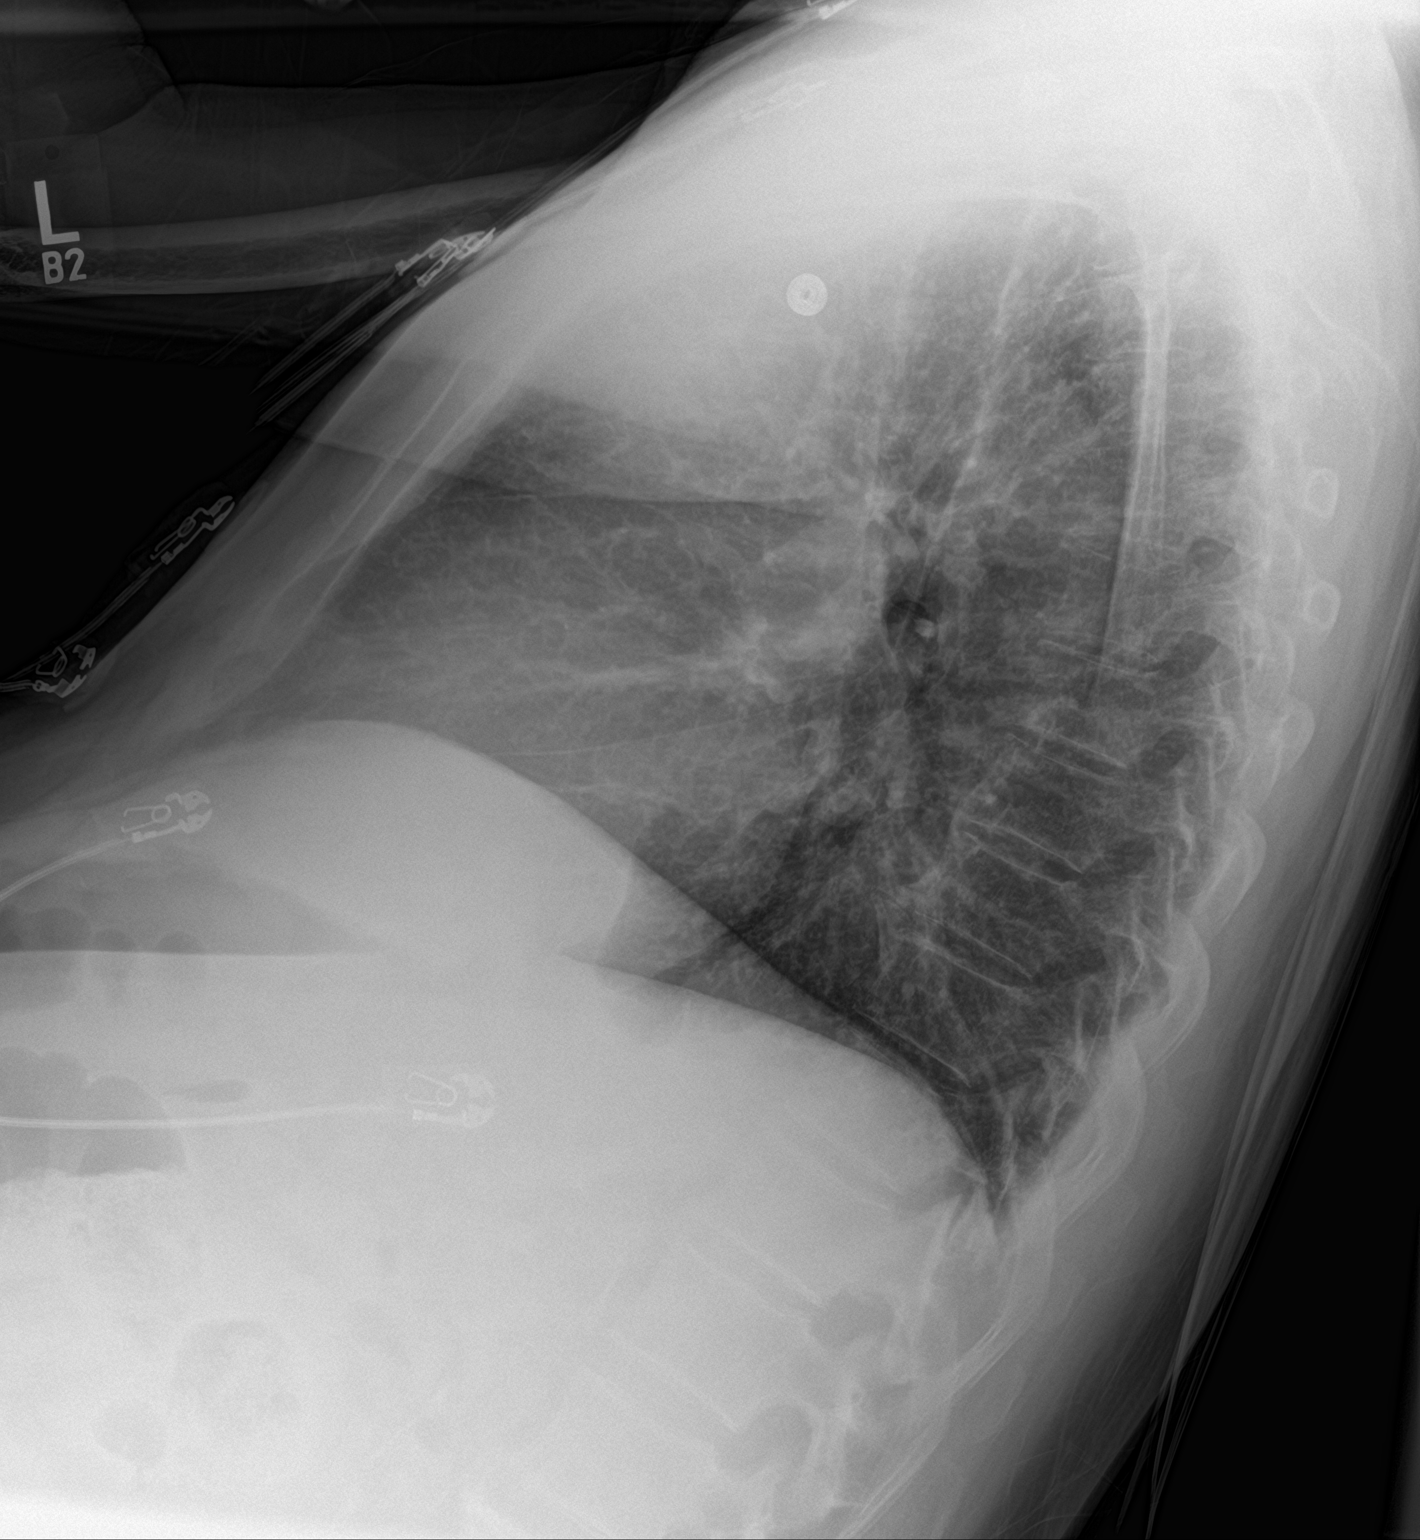

[chest ap]
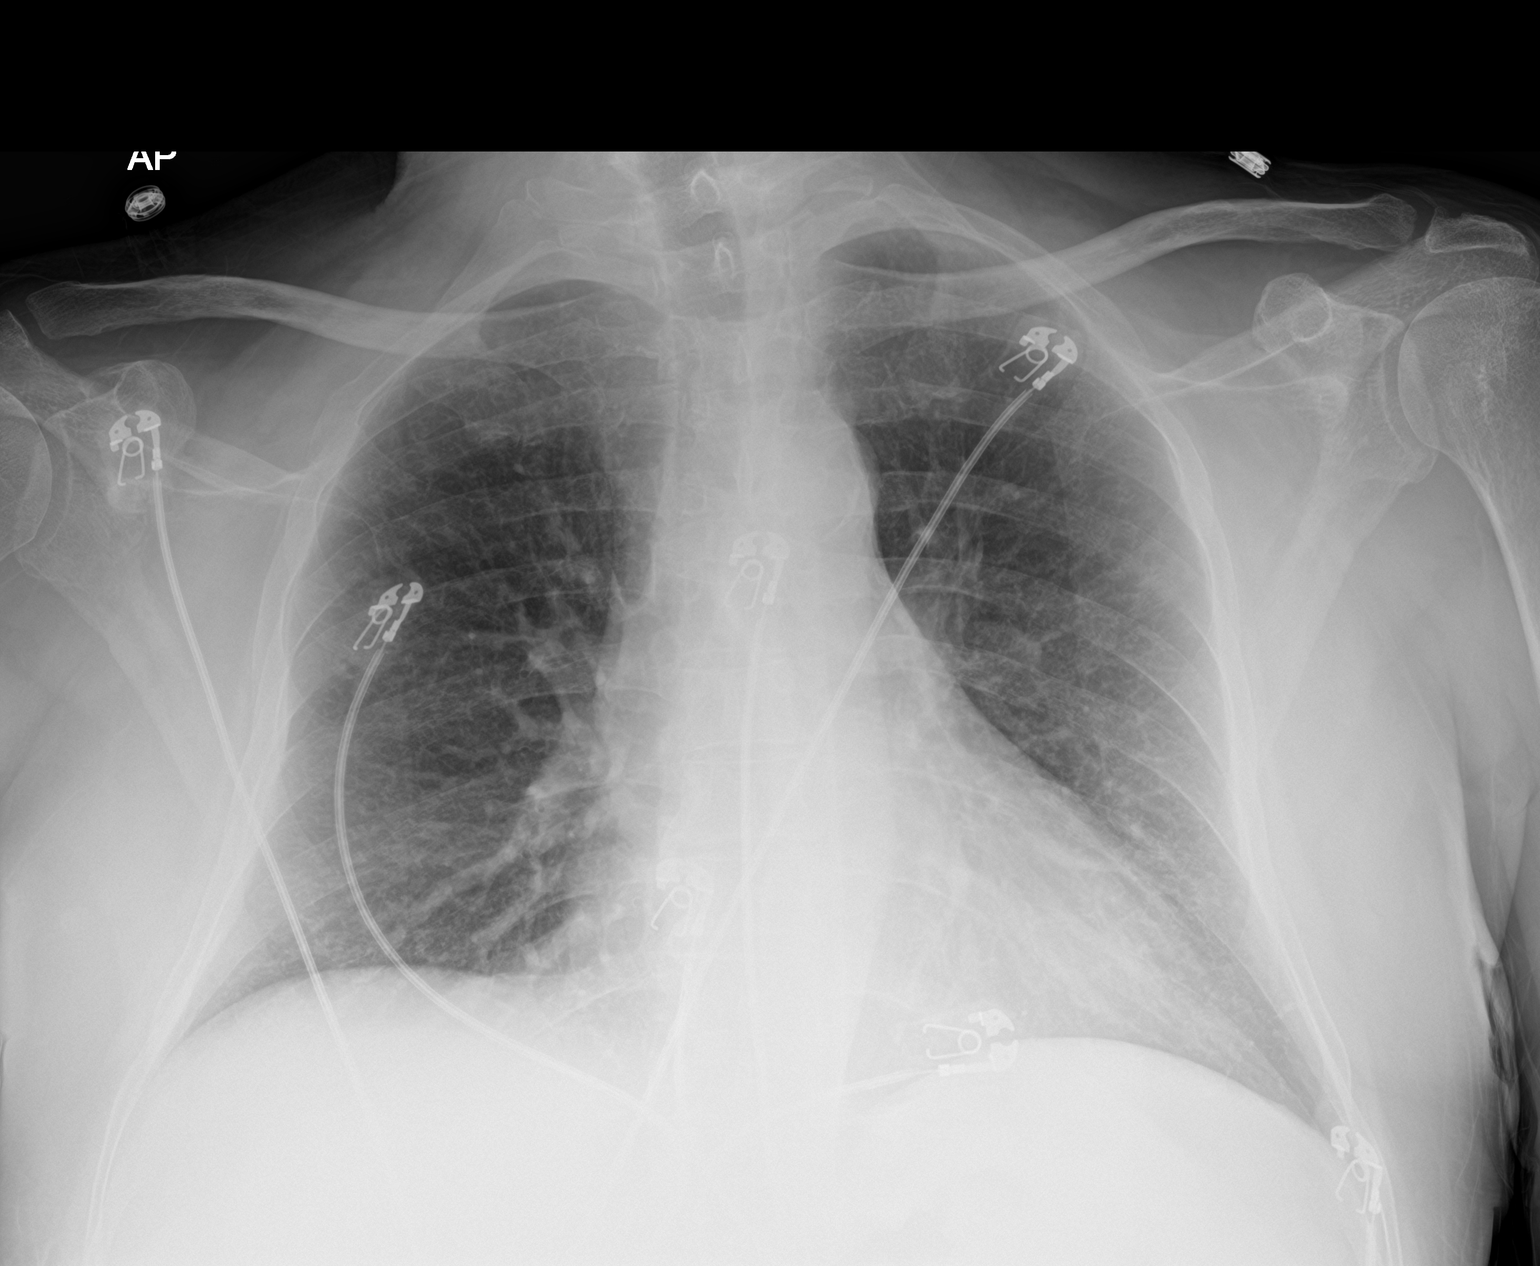

[2 of 2 positions shown; findings below may reference images not displayed]

FINDINGS: There is mild bilateral interstitial prominence. There is no focal
consolidation. There is no pleural effusion or pneumothorax. The
cardiomediastinal silhouette is stable.

The osseous structures are unremarkable.
IMPRESSION: No active cardiopulmonary disease.

## 2020-04-28 ENCOUNTER — Ambulatory Visit: Payer: Medicare Other | Admitting: Adult Health

## 2020-04-28 VITALS — BP 121/66 | HR 101 | Ht 72.0 in

## 2020-04-28 DIAGNOSIS — R269 Unspecified abnormalities of gait and mobility: Secondary | ICD-10-CM

## 2020-04-28 DIAGNOSIS — G35 Multiple sclerosis: Secondary | ICD-10-CM

## 2020-04-28 NOTE — Progress Notes (Signed)
I have read the note, and I agree with the clinical assessment and plan.  Derek Collins Derek Collins   

## 2020-04-28 NOTE — Progress Notes (Signed)
PATIENT: Derek Collins DOB: 01/10/1951  REASON FOR VISIT: follow up HISTORY FROM: patient  HISTORY OF PRESENT ILLNESS: Today 04/28/20:  Derek Collins is a 70 year old male with a history of multiple sclerosis.  He returns today for follow-up.  He is no longer on any disease modifying therapy.  He is not interested in trying any medication.  He stopped oxybutynin as he did not feel it was beneficial.  He also stopped Cymbalta for the same reason.  He is able to ambulate short distances but otherwise uses a wheelchair.  He states that his legs feel heavy.  His legs are swollen.  Reports that he discussed with his PCP.  However it does not sound like the patient would be interested in taking medication to help with fluid retention due to increased urination.  Patient's wife reports that she has a hard time with transfers.  For that reason the patient stays at home except for doctors appointments.  10/27/19: Derek Collins is a 70 year old male with a history of multiple sclerosis.  He returns today for follow-up.  He reports that he is no longer taking Rebif.  Reports that he was not taking it consistently before but decided he no longer wanted to be on this medication.  He continues to have the most trouble out of the right leg.  Reports that his gait is very slow but fortunately has not had any recent falls.  He does use a walker when ambulating.  Denies any new numbness or weakness.  No changes with his vision.  Continues to have incontinence of bladder.  He does see a urologist.  He has participated in physical therapy in the past reports that he did not find it beneficial.  HISTORY 09/29/18:  Derek Collins is a 70 year old male with a history of multiple sclerosis.  He returns today for follow-up.  He is currently on Rebif and tolerating it well.  Although his daughter who is with him today reports that he does not take it consistently.  She also reports he does not take his insulin consistently.  He denies any  new numbness or weakness.  He has always had more weakness on the right side.  He continues to have trouble with his gait and balance.  He does have a cane and walker that he will use.  Reports that he had a fall 2 weeks ago trying to get out of bed.  Fortunately no injuries.  The patient wears depends due urinary incontinence.  The patient and his daughter reports this is a big concern for the patient.  He has tried [employer] and Ditropan.  He reports that Ditropan helps some but he tends to urinate through his depends.  For that reason he typically does not go out of the house much.  Denies any changes with his vision.  He joins me today for follow-up.  REVIEW OF SYSTEMS: Out of a complete 14 system review of symptoms, the patient complains only of the following symptoms, and all other reviewed systems are negative.  See HPI  ALLERGIES: No Known Allergies  HOME MEDICATIONS: Outpatient Medications Prior to Visit  Medication Sig Dispense Refill  . DULoxetine (CYMBALTA) 30 MG capsule Take 30 mg by mouth daily.    . DULoxetine (CYMBALTA) 60 MG capsule Take 1 capsule by mouth twice daily 60 capsule 5  . Insulin Glargine 300 UNIT/ML SOPN Inject 50 Units into the skin at bedtime.    . ONE TOUCH ULTRA TEST test strip  1 each by Other route 3 (three) times daily. -Contour    . OZEMPIC, 0.25 OR 0.5 MG/DOSE, 2 MG/1.5ML SOPN SMARTSIG:0.75 Milliliter(s) SUB-Q Once a Week    . pravastatin (PRAVACHOL) 40 MG tablet Take 40 mg by mouth daily.    . tamsulosin (FLOMAX) 0.4 MG CAPS capsule Take 0.4 mg by mouth daily.     Marland Kitchen oxybutynin (DITROPAN) 5 MG tablet TAKE 1 TABLET BY MOUTH THREE TIMES DAILY 270 tablet 0   No facility-administered medications prior to visit.    PAST MEDICAL HISTORY: Past Medical History:  Diagnosis Date  . Carpal tunnel syndrome, bilateral   . Chronic fatigue   . Chronic leg pain   . Chronic low back pain   . Cystoid macular edema of right eye 01/28/2020  . Depression   . Diabetes  (HCC)   . Diabetic peripheral neuropathy (HCC)   . Gait disorder   . Macular degeneration of left eye   . Multiple sclerosis (HCC)   . Obesity   . Renal calculi   . Urinary frequency     PAST SURGICAL HISTORY: Past Surgical History:  Procedure Laterality Date  . EYE SURGERY Right    cataract  . EYE SURGERY Right 07/2016   repair of previous surgery  . HERNIA REPAIR    . TONSILLECTOMY      FAMILY HISTORY: Family History  Problem Relation Age of Onset  . Stroke Father   . Kidney failure Mother   . High blood pressure Mother   . Cancer Sister        Breast cancer/Renal cell carcinoma    SOCIAL HISTORY: Social History   Socioeconomic History  . Marital status: Married    Spouse name: Not on file  . Number of children: 2  . Years of education: 1-College  . Highest education level: Not on file  Occupational History  . Occupation: retired    Associate Professor: OTHER    Comment: [employer]  Tobacco Use  . Smoking status: Former Smoker    Packs/day: 1.00    Years: 25.00    Pack years: 25.00    Types: Cigarettes  . Smokeless tobacco: Never Used  . Tobacco comment: Quit 30 years ago.  Vaping Use  . Vaping Use: Never used  Substance and Sexual Activity  . Alcohol use: No    Alcohol/week: 0.0 standard drinks    Comment: Consumes beer on occasion  . Drug use: No  . Sexual activity: Not on file  Other Topics Concern  . Not on file  Social History Narrative   Patient lives at home with his wife Derek Collins.    Patient is disabled.   Education high school two years vocational.   Left handed.   Caffeine two soda's daily.   Social Determinants of Health   Financial Resource Strain: Not on file  Food Insecurity: Not on file  Transportation Needs: Not on file  Physical Activity: Not on file  Stress: Not on file  Social Connections: Not on file  Intimate Partner Violence: Not on file      PHYSICAL EXAM  Vitals:   04/28/20 1315  BP: 121/66  Pulse: (!) 101  Height:  6' (1.829 m)   Body mass index is 25.77 kg/m.  Generalized: Well developed, in no acute distress   Neurological examination  Mentation: Alert oriented to time, place, history taking. Follows all commands speech and language fluent Cranial nerve II-XII: Pupils were equal round reactive to light. Extraocular movements were full,  visual field were full on confrontational test.. Head turning and shoulder shrug  were normal and symmetric. Motor: The motor testing reveals 5 over 5 strength of all 4 extremities. Good symmetric motor tone is noted throughout.  Right foot drop noted. 2+ pitting edema in the lower extremities bilaterally Sensory: Sensory testing is intact to soft touch on all 4 extremities. No evidence of extinction is noted.  Coordination: Cerebellar testing reveals good finger-nose-finger bilaterally.  Unable to complete heel-to-shin due to mobility Gait and station: Patient was in a wheelchair   DIAGNOSTIC DATA (LABS, IMAGING, TESTING) - I reviewed patient records, labs, notes, testing and imaging myself where available.  Lab Results  Component Value Date   WBC 7.5 09/29/2018   HGB 13.9 09/29/2018   HCT 40.4 09/29/2018   MCV 87 09/29/2018   PLT 300 09/29/2018      Component Value Date/Time   NA 133 (L) 09/29/2018 1340   K 4.2 09/29/2018 1340   CL 97 09/29/2018 1340   CO2 21 09/29/2018 1340   GLUCOSE 375 (H) 09/29/2018 1340   GLUCOSE 207 (H) 03/15/2018 0921   BUN 24 09/29/2018 1340   CREATININE 1.18 09/29/2018 1340   CALCIUM 11.1 (H) 09/29/2018 1340   PROT 6.7 09/29/2018 1340   ALBUMIN 4.5 09/29/2018 1340   AST 15 09/29/2018 1340   ALT 15 09/29/2018 1340   ALKPHOS 110 09/29/2018 1340   BILITOT 0.4 09/29/2018 1340   GFRNONAA 63 09/29/2018 1340   GFRAA 73 09/29/2018 1340   No results found for: CHOL, HDL, LDLCALC, LDLDIRECT, TRIG, CHOLHDL Lab Results  Component Value Date   HGBA1C 9.6 (H) 03/13/2018   No results found for: VITAMINB12 Lab Results  Component  Value Date   TSH 1.365 03/15/2018      ASSESSMENT AND PLAN 70 y.o. year old male  has a past medical history of Carpal tunnel syndrome, bilateral, Chronic fatigue, Chronic leg pain, Chronic low back pain, Cystoid macular edema of right eye (01/28/2020), Depression, Diabetes ([place]), Diabetic peripheral neuropathy ([place]), Gait disorder, Macular degeneration of left eye, Multiple sclerosis ([place]), Obesity, Renal calculi, and Urinary frequency. here with:  Multiple sclerosis   Patient does not wish to be on any medication  Last MRI of the brain and cervical spine was on October 27, 2018 and was stable   Abnormality of gait   Home health ordered for physical therapy: Hopefully they can assist with education regarding transfers and any additional assistive device that would be helpful      Advised if symptoms worsen or he develops new symptoms he should let us know follow-up in 6 months or sooner if needed  I spent 30 minutes of face-to-face and non-face-to-face time with patient.  This included previsit chart review, lab review, study review, order entry, electronic health record documentation, patient education.  Ward Givens, MSN, NP-C 04/28/2020, 1:38 PM [facility] [address] [place], [place] [address] [phone]

## 2020-04-28 NOTE — Patient Instructions (Addendum)
Your Plan:  Referral to for home PT If your symptoms worsen or you develop new symptoms please let us know.    Thank you for coming to see [place] at [facility]. I hope we have been able to provide you high quality care today.  You may receive a patient satisfaction survey over the next few weeks. We would appreciate your feedback and comments so that we may continue to improve ourselves and the health of our patients.

## 2020-05-30 ENCOUNTER — Other Ambulatory Visit: Payer: Self-pay | Admitting: Adult Health

## 2020-06-02 ENCOUNTER — Other Ambulatory Visit: Payer: Self-pay | Admitting: Adult Health

## 2020-06-08 ENCOUNTER — Other Ambulatory Visit: Payer: Self-pay | Admitting: Adult Health

## 2020-06-08 NOTE — Telephone Encounter (Signed)
Pt's wife returned call. Please call back when available.  °

## 2020-06-08 NOTE — Telephone Encounter (Signed)
Pt's wife called stating that the pharmacy had sent in a refill request for the pt's DULoxetine (CYMBALTA) 60 MG capsule and they had not heard back from the office. Wife states the pt has been completely out for several days and would like to know when this will be called in for the pt to the [facility] in [place]. Please advise.

## 2020-06-08 NOTE — Telephone Encounter (Signed)
This medication really should be taken on a daily basis not necessarily as needed.

## 2020-06-08 NOTE — Telephone Encounter (Signed)
I called pt and LMVM for him that last note he had stopped the medication was not beneficial.  Spoke to wife.  He has been taking as when he does not he if very ILL.  She states has not taken in the last 3-4 days.  If ok will reorder.  Takes a 30mg  and 60mg  tab daily. (total 90mg ). I called the pharmacy and pt last filled the 30mg  daily 90 day supply 04-02-20, then 60mg  last fill 09-2019 (90 day supply).  How do you want to proceed?

## 2020-06-13 ENCOUNTER — Telehealth: Payer: Self-pay | Admitting: Adult Health

## 2020-06-13 NOTE — Telephone Encounter (Signed)
Pt.'s wife Olegario Messier is asking why Duloxetine has been denied for pt. She states he really needs this medication because he is very irritable & mean. Wife states she needs him to have this medication for her sanity. Please advise.

## 2020-06-13 NOTE — Telephone Encounter (Signed)
Spoke to wife again.  I relayed that I did send to MM/NP for review.  Wife does give it daily for her bene fit as he is irritable and cross if does not have it.  I relayed per MM/NP is not to be given as needed.  She is aware.  Needed 60mg  capsules.  I relayed that Dr. and MM.NP out today,  She may contact her pcp for this medication as is for anxiety, depression.  She will contact them if they do not fill will call Anne Hahn back.

## 2020-06-15 NOTE — Telephone Encounter (Signed)
Se other note.  I spoke to wife and she will contact pcp to fill.

## 2020-11-09 ENCOUNTER — Ambulatory Visit: Payer: Medicare Other | Admitting: Neurology

## 2020-11-15 ENCOUNTER — Emergency Department (HOSPITAL_COMMUNITY): Payer: Medicare Other

## 2020-11-15 ENCOUNTER — Emergency Department (HOSPITAL_COMMUNITY)
Admission: EM | Admit: 2020-11-15 | Discharge: 2020-11-16 | Disposition: A | Discharge status: Home / Self Care | Payer: Medicare Other | Attending: Physician Assistant | Admitting: Physician Assistant

## 2020-11-15 DIAGNOSIS — R531 Weakness: Secondary | ICD-10-CM | POA: Diagnosis present

## 2020-11-15 DIAGNOSIS — Z5321 Procedure and treatment not carried out due to patient leaving prior to being seen by health care provider: Secondary | ICD-10-CM | POA: Insufficient documentation

## 2020-11-15 LAB — CBC WITH DIFFERENTIAL/PLATELET
Abs Immature Granulocytes: 0.08 10*3/uL — ABNORMAL HIGH (ref 0.00–0.07)
Basophils Absolute: 0.1 10*3/uL (ref 0.0–0.1)
Basophils Relative: 0 %
Eosinophils Absolute: 0.1 10*3/uL (ref 0.0–0.5)
Eosinophils Relative: 1 %
HCT: 44.9 % (ref 39.0–52.0)
Hemoglobin: 15 g/dL (ref 13.0–17.0)
Immature Granulocytes: 1 %
Lymphocytes Relative: 11 %
Lymphs Abs: 1.6 10*3/uL (ref 0.7–4.0)
MCH: 30.1 pg (ref 26.0–34.0)
MCHC: 33.4 g/dL (ref 30.0–36.0)
MCV: 90.2 fL (ref 80.0–100.0)
Monocytes Absolute: 1.4 10*3/uL — ABNORMAL HIGH (ref 0.1–1.0)
Monocytes Relative: 10 %
Neutro Abs: 11 10*3/uL — ABNORMAL HIGH (ref 1.7–7.7)
Neutrophils Relative %: 77 %
Platelets: 460 10*3/uL — ABNORMAL HIGH (ref 150–400)
RBC: 4.98 MIL/uL (ref 4.22–5.81)
RDW: 13.2 % (ref 11.5–15.5)
WBC: 14.2 10*3/uL — ABNORMAL HIGH (ref 4.0–10.5)
nRBC: 0 % (ref 0.0–0.2)

## 2020-11-15 LAB — COMPREHENSIVE METABOLIC PANEL
ALT: 12 U/L (ref 0–44)
AST: 15 U/L (ref 15–41)
Albumin: 3.4 g/dL — ABNORMAL LOW (ref 3.5–5.0)
Alkaline Phosphatase: 104 U/L (ref 38–126)
Anion gap: 12 (ref 5–15)
BUN: 27 mg/dL — ABNORMAL HIGH (ref 8–23)
CO2: 23 mmol/L (ref 22–32)
Calcium: 10.2 mg/dL (ref 8.9–10.3)
Chloride: 101 mmol/L (ref 98–111)
Creatinine, Ser: 1.25 mg/dL — ABNORMAL HIGH (ref 0.61–1.24)
GFR, Estimated: >60 mL/min (ref >60)
Glucose, Bld: 131 mg/dL — ABNORMAL HIGH (ref 70–99)
Potassium: 4.7 mmol/L (ref 3.5–5.1)
Sodium: 136 mmol/L (ref 135–145)
Total Bilirubin: 1.3 mg/dL — ABNORMAL HIGH (ref 0.3–1.2)
Total Protein: 7.7 g/dL (ref 6.5–8.1)

## 2020-11-15 NOTE — ED Triage Notes (Signed)
Pt here via EMS for no appetite, weakness x1.5wks. Denies NVD, CP, SHOB, no additional complaints. Pt did not want to come to ED, family advised him to be seen Hx MS, DM VSS en route

## 2020-11-15 NOTE — ED Provider Notes (Signed)
Emergency Medicine Provider Triage Evaluation Note  Derek Collins , a 70 y.o. male  was evaluated in triage.  Pt complains of generalized weakness x 1.5 weeks. He has been trying to stay hydrated and drink plenty of fluids. Admit to shortness of breath when changing positions which is new for him. Has history of MS and diabetes.  Review of Systems  Positive: Generalized weakness, shortness of breath Negative: Fever, chills, chest pain, urinary symptoms  Physical Exam  BP 115/61 (BP Location: Left Arm)   Pulse (!) 107   Temp 98 F (36.7 C) (Oral)   Resp 16   SpO2 97%  Gen:   Awake, no distress   Resp:  Normal effort  MSK:   Moves extremities without difficulty  Other:  Tachycardic to 107.  Full grip strength in all extremities.  Speech is clear.  No facial droop.  Medical Decision Making  Medically screening exam initiated at 8:40 PM.  Appropriate orders placed.  Derek Collins was informed that the remainder of the evaluation will be completed by another provider, this initial triage assessment does not replace that evaluation, and the importance of remaining in the ED until their evaluation is complete.  Basic labs, UA and chest x-ray ordered.   Portions of this note were generated with Scientist, clinical (histocompatibility and immunogenetics). Dictation errors may occur despite best attempts at proofreading.    Derek Ace, PA-C 11/15/20 2041    Derek Bale, MD 11/15/20 2241

## 2021-04-12 ENCOUNTER — Ambulatory Visit: Payer: Medicare Other | Admitting: Adult Health

## 2021-07-24 ENCOUNTER — Ambulatory Visit: Payer: Medicare Other | Admitting: Adult Health

## 2021-10-23 ENCOUNTER — Telehealth: Payer: Self-pay | Admitting: Adult Health

## 2021-10-23 NOTE — Telephone Encounter (Signed)
..   Pt understands that although there may be some limitations with this type of visit, we will take all precautions to reduce any security or privacy concerns.  Pt understands that this will be treated like an in office visit and we will file with pt's insurance, and there may be a patient responsible charge related to this service. ? ?

## 2021-11-06 ENCOUNTER — Ambulatory Visit: Payer: Medicare Other | Admitting: Adult Health

## 2021-12-20 ENCOUNTER — Telehealth: Payer: Self-pay | Admitting: Adult Health

## 2021-12-20 ENCOUNTER — Telehealth (INDEPENDENT_AMBULATORY_CARE_PROVIDER_SITE_OTHER): Payer: Medicare Other | Admitting: Adult Health

## 2021-12-20 DIAGNOSIS — G35 Multiple sclerosis: Secondary | ICD-10-CM

## 2021-12-20 NOTE — Telephone Encounter (Signed)
Contacted patient to remind of MyChart Video Visit for today. Pt's wife said waiting on daughter to get here to set it up.

## 2021-12-20 NOTE — Progress Notes (Signed)
PATIENT: Derek Collins DOB: [birth year]  REASON FOR VISIT: follow up HISTORY FROM: patient  Virtual Visit via Video Note  I connected with Derek Collins on 12/20/21 at 10:00 AM EDT by a video enabled telemedicine application located remotely at [facility] and verified that I am speaking with the correct person using two identifiers who was located at their own home.   I discussed the limitations of evaluation and management by telemedicine and the availability of in person appointments. The patient expressed understanding and agreed to proceed.   PATIENT: Derek Collins DOB: 11/10/1950  REASON FOR VISIT: follow up HISTORY FROM: patient  HISTORY OF PRESENT ILLNESS: Today 12/20/21: Derek Collins is a 71 year old male with a history of MS. He returns today for follow-up.  The patient and a family member reports that he Has been in a hospital bed since earlier this year. Doesn't get up much. Uses a depends for urination. Good appetite. No change in mood and behavior.  The patient does not wish for any further work-up or treatment in regards to his MS.  He has not been on any disease modifying therapy in quite some time.  Does not wish to repeat any imaging.  He returns today for an evaluation.  HISTORY 04/28/20:   Derek Collins is a 71 year old male with a history of multiple sclerosis.  He returns today for follow-up.  He is no longer on any disease modifying therapy.  He is not interested in trying any medication.  He stopped oxybutynin as he did not feel it was beneficial.  He also stopped Cymbalta for the same reason.  He is able to ambulate short distances but otherwise uses a wheelchair.  He states that his legs feel heavy.  His legs are swollen.  Reports that he discussed with his PCP.  However it does not sound like the patient would be interested in taking medication to help with fluid retention due to increased urination.  Patient's wife reports that she has a hard time  with transfers.  For that reason the patient stays at home except for doctors appointments.  REVIEW OF SYSTEMS: Out of a complete 14 system review of symptoms, the patient complains only of the following symptoms, and all other reviewed systems are negative.  ALLERGIES: No Known Allergies  HOME MEDICATIONS: Outpatient Medications Prior to Visit  Medication Sig Dispense Refill   Insulin Glargine 300 UNIT/ML SOPN Inject 50 Units into the skin at bedtime.     ONE TOUCH ULTRA TEST test strip 1 each by Other route 3 (three) times daily. -Contour     OZEMPIC, 0.25 OR 0.5 MG/DOSE, 2 MG/1.5ML SOPN SMARTSIG:0.75 Milliliter(s) SUB-Q Once a Week     pravastatin (PRAVACHOL) 40 MG tablet Take 40 mg by mouth daily.     tamsulosin (FLOMAX) 0.4 MG CAPS capsule Take 0.4 mg by mouth daily.      No facility-administered medications prior to visit.    PAST MEDICAL HISTORY: Past Medical History:  Diagnosis Date   Carpal tunnel syndrome, bilateral    Chronic fatigue    Chronic leg pain    Chronic low back pain    Cystoid macular edema of right eye 01/28/2020   Depression    Diabetes (HCC)    Diabetic peripheral neuropathy (HCC)    Gait disorder    Macular degeneration of left eye    Multiple sclerosis (HCC)    Obesity    Renal calculi  Urinary frequency     PAST SURGICAL HISTORY: Past Surgical History:  Procedure Laterality Date   EYE SURGERY Right    cataract   EYE SURGERY Right 07/2016   repair of previous surgery   HERNIA REPAIR     TONSILLECTOMY      FAMILY HISTORY: Family History  Problem Relation Age of Onset   Stroke Father    Kidney failure Mother    High blood pressure Mother    Cancer Sister        Breast cancer/Renal cell carcinoma    SOCIAL HISTORY: Social History   Socioeconomic History   Marital status: Married    Spouse name: Not on file   Number of children: 2   Years of education: 1-College   Highest education level: Not on file  Occupational History    Occupation: retired    Associate Professor: OTHER    Comment: [employer]  Tobacco Use   Smoking status: Former    Packs/day: 1.00    Years: 25.00    Total pack years: 25.00    Types: Cigarettes   Smokeless tobacco: Never   Tobacco comments:    Quit 30 years ago.  Vaping Use   Vaping Use: Never used  Substance and Sexual Activity   Alcohol use: No    Alcohol/week: 0.0 standard drinks of alcohol    Comment: Consumes beer on occasion   Drug use: No   Sexual activity: Not on file  Other Topics Concern   Not on file  Social History Narrative   Patient lives at home with his wife Derek Collins.    Patient is disabled.   Education high school two years vocational.   Left handed.   Caffeine two soda's daily.   Social Determinants of Health   Financial Resource Strain: Not on file  Food Insecurity: Not on file  Transportation Needs: Not on file  Physical Activity: Not on file  Stress: Not on file  Social Connections: Not on file  Intimate Partner Violence: Not on file      PHYSICAL EXAM Generalized: Well developed, in no acute distress   Neurological examination  Mentation: Alert oriented to time, place, history taking. Follows all commands speech and language fluent Cranial nerve II-XII:Extraocular movements were full. Facial symmetry noted. Marland Kitchen Head turning and shoulder shrug  were normal and symmetric. Gait and station: In hospital bed Reflexes: UTA  DIAGNOSTIC DATA (LABS, IMAGING, TESTING) - I reviewed patient records, labs, notes, testing and imaging myself where available.  Lab Results  Component Value Date   WBC 14.2 (H) 11/15/2020   HGB 15.0 11/15/2020   HCT 44.9 11/15/2020   MCV 90.2 11/15/2020   PLT 460 (H) 11/15/2020      Component Value Date/Time   NA 136 11/15/2020 2037   NA 133 (L) 09/29/2018 1340   K 4.7 11/15/2020 2037   CL 101 11/15/2020 2037   CO2 23 11/15/2020 2037   GLUCOSE 131 (H) 11/15/2020 2037   BUN 27 (H) 11/15/2020 2037   BUN 24 09/29/2018 1340    CREATININE 1.25 (H) 11/15/2020 2037   CALCIUM 10.2 11/15/2020 2037   PROT 7.7 11/15/2020 2037   PROT 6.7 09/29/2018 1340   ALBUMIN 3.4 (L) 11/15/2020 2037   ALBUMIN 4.5 09/29/2018 1340   AST 15 11/15/2020 2037   ALT 12 11/15/2020 2037   ALKPHOS 104 11/15/2020 2037   BILITOT 1.3 (H) 11/15/2020 2037   BILITOT 0.4 09/29/2018 1340   GFRNONAA >60 11/15/2020 2037  GFRAA 73 09/29/2018 1340   No results found for: "CHOL", "HDL", "LDLCALC", "LDLDIRECT", "TRIG", "CHOLHDL" Lab Results  Component Value Date   HGBA1C 9.6 (H) 03/13/2018   No results found for: "VITAMINB12" Lab Results  Component Value Date   TSH 1.365 03/15/2018      ASSESSMENT AND PLAN 71 y.o. year old male  has a past medical history of Carpal tunnel syndrome, bilateral, Chronic fatigue, Chronic leg pain, Chronic low back pain, Cystoid macular edema of right eye (01/28/2020), Depression, Diabetes (HCC), Diabetic peripheral neuropathy (HCC), Gait disorder, Macular degeneration of left eye, Multiple sclerosis (HCC), Obesity, Renal calculi, and Urinary frequency. here with:  1.  Multiple sclerosis  The patient is now immobile, currently in a hospital bed.  He does not wish for any additional treatment in regards to his MS. he also does not wish to repeat any imaging.  The patient reports that he has regular follow-ups with his primary care provider.  He will follow-up with our office on an as-needed basis.    Butch Penny, MSN, NP-C 12/20/2021, 10:15 AM [facility] [address], [address] [place], [place] [address] [phone]

## 2022-01-29 ENCOUNTER — Encounter (INDEPENDENT_AMBULATORY_CARE_PROVIDER_SITE_OTHER): Payer: Medicare Other | Admitting: Ophthalmology

## 2022-12-29 IMAGING — CR DG CHEST 1V
1 series · 1 of 1 positions shown · non-contrast
Comparison: Chest radiograph dated 03/13/2018.

CLINICAL DATA: 70-year-old male with shortness of breath.

EXAM:
CHEST  1 VIEW

[chest ap]
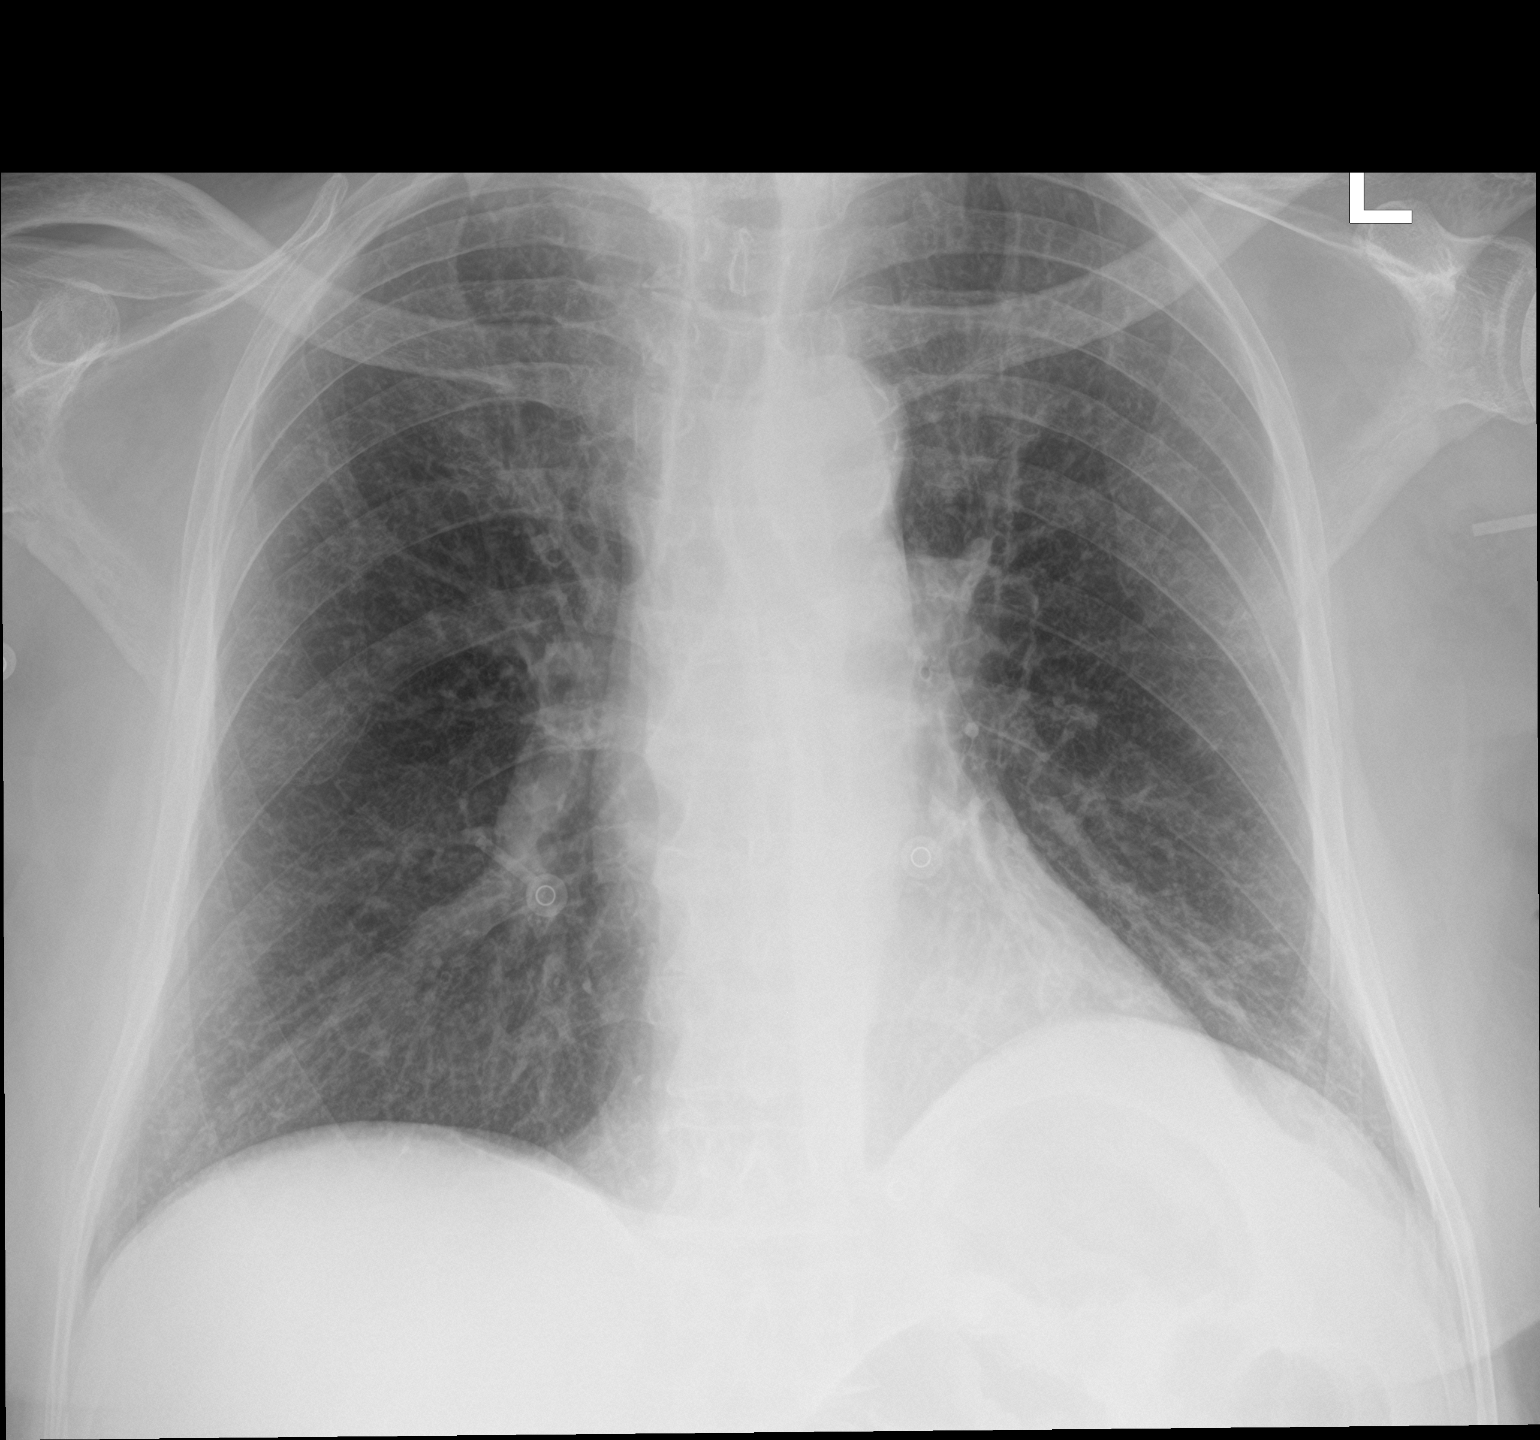

[1 of 1 positions shown; findings below may reference images not displayed]

FINDINGS: There is mild diffuse interstitial coarsening. No focal
consolidation, pleural effusion, pneumothorax. The cardiac
silhouette is within limits. Atherosclerotic calcification of the
aorta. No acute osseous pathology.
IMPRESSION: No active disease.
